# Patient Record
Sex: Male | Born: 1975 | Race: Asian | Hispanic: No | Marital: Married | Smoking: Former smoker
Health system: Southern US, Community
[De-identification: ages and names within clinical notes are randomized; demographics above are authoritative.]

## PROBLEM LIST (undated history)

## (undated) DIAGNOSIS — E785 Hyperlipidemia, unspecified: Secondary | ICD-10-CM

## (undated) HISTORY — PX: APPENDECTOMY: SHX54

## (undated) HISTORY — DX: Hyperlipidemia, unspecified: E78.5

---

## 2004-07-09 ENCOUNTER — Ambulatory Visit: Payer: Self-pay | Admitting: Internal Medicine

## 2008-05-13 ENCOUNTER — Ambulatory Visit: Payer: Self-pay | Admitting: Family Medicine

## 2008-05-13 DIAGNOSIS — F17200 Nicotine dependence, unspecified, uncomplicated: Secondary | ICD-10-CM | POA: Insufficient documentation

## 2008-05-13 HISTORY — DX: Nicotine dependence, unspecified, uncomplicated: F17.200

## 2008-05-15 ENCOUNTER — Telehealth (INDEPENDENT_AMBULATORY_CARE_PROVIDER_SITE_OTHER): Payer: Self-pay | Admitting: *Deleted

## 2008-05-15 LAB — CONVERTED CEMR LAB
ALT: 30 units/L (ref 0–53)
Albumin: 4.2 g/dL (ref 3.5–5.2)
Alkaline Phosphatase: 58 units/L (ref 39–117)
BUN: 13 mg/dL (ref 6–23)
Calcium: 9.5 mg/dL (ref 8.4–10.5)
Eosinophils Relative: 1.4 % (ref 0.0–5.0)
GFR calc Af Amer: 111 mL/min
Glucose, Bld: 112 mg/dL — ABNORMAL HIGH (ref 70–99)
HCT: 43.2 % (ref 39.0–52.0)
Hemoglobin: 15 g/dL (ref 13.0–17.0)
Monocytes Absolute: 0.4 10*3/uL (ref 0.1–1.0)
Monocytes Relative: 5.4 % (ref 3.0–12.0)
Neutro Abs: 4.1 10*3/uL (ref 1.4–7.7)
Potassium: 4.3 meq/L (ref 3.5–5.1)
Total CHOL/HDL Ratio: 6.6
Total Protein: 7.4 g/dL (ref 6.0–8.3)
WBC: 6.8 10*3/uL (ref 4.5–10.5)

## 2010-04-24 ENCOUNTER — Encounter: Payer: Self-pay | Admitting: Family Medicine

## 2010-04-24 ENCOUNTER — Ambulatory Visit
Admission: RE | Admit: 2010-04-24 | Discharge: 2010-04-24 | Payer: Self-pay | Source: Home / Self Care | Admitting: Family Medicine

## 2010-04-26 ENCOUNTER — Telehealth (INDEPENDENT_AMBULATORY_CARE_PROVIDER_SITE_OTHER): Payer: Self-pay | Admitting: *Deleted

## 2010-05-07 NOTE — Assessment & Plan Note (Signed)
Summary: POSSIBLE STREP THROAT/ NH (rm 2)   Vital Signs:  Patient Profile:   35 Years Old Male CC:      sore throat & ?fever  x 3 days,  Height:     73 inches Weight:      229 pounds O2 Sat:      98 % O2 treatment:    Room Air Temp:     98.8 degrees F oral Pulse rate:   79 / minute Resp:     14 per minute BP sitting:   123 / 77  (left arm) Cuff size:   large  Vitals Entered By: Lajean Saver RN (April 24, 2010 4:08 PM)                  Updated Prior Medication List: No Medications Current Allergies: No known allergies History of Present Illness Chief Complaint: sore throat & ?fever  x 3 days,  History of Present Illness:  Subjective: Patient complains of sore throat for 3 to 4 days. No cough No pleuritic pain No wheezing No nasal congestion No post-nasal drainage No sinus pain/pressure No itchy/red eyes No earache No hemoptysis No SOB No fever, ? chills No nausea No vomiting No abdominal pain No diarrhea No skin rashes + fatigue No myalgias No headache    REVIEW OF SYSTEMS Constitutional Symptoms       Complains of chills.     Denies fever, night sweats, weight loss, weight gain, and fatigue.  Eyes       Denies change in vision, eye pain, eye discharge, glasses, contact lenses, and eye surgery. Ear/Nose/Throat/Mouth       Complains of sore throat.      Denies hearing loss/aids, change in hearing, ear pain, ear discharge, dizziness, frequent runny nose, frequent nose bleeds, sinus problems, hoarseness, and tooth pain or bleeding.  Respiratory       Denies dry cough, productive cough, wheezing, shortness of breath, asthma, bronchitis, and emphysema/COPD.  Cardiovascular       Denies murmurs, chest pain, and tires easily with exhertion.    Gastrointestinal       Denies stomach pain, nausea/vomiting, diarrhea, constipation, blood in bowel movements, and indigestion. Genitourniary       Denies painful urination, kidney stones, and loss of urinary  control. Neurological       Denies paralysis, seizures, and fainting/blackouts. Musculoskeletal       Denies muscle pain, joint pain, joint stiffness, decreased range of motion, redness, swelling, muscle weakness, and gout.  Skin       Denies bruising, unusual mles/lumps or sores, and hair/skin or nail changes.  Psych       Denies mood changes, temper/anger issues, anxiety/stress, speech problems, depression, and sleep problems.  Past History:  Past Medical History: Unremarkable  Past Surgical History: Appendectomy  Family History: none  Social History: Occupation: Occupational hygienist Married Current Smoker 1 PPD Alcohol use-no Drug use-no 2 childrenSmoking Status:  current Drug Use:  no   Objective:  Appearance:  Patient appears healthy, stated age, and in no acute distress j Eyes:  Pupils are equal, round, and reactive to light and accomdation.  Extraocular movement is intact.  Conjunctivae are not inflamed.  Ears:  Canals normal.  Tympanic membranes normal.   Nose:  Normal septum.  Normal turbinates, mildly congested.  No sinus tenderness present.  Pharynx:  Erythematous and slightly swollen without obstruction.  Minimal exudate.  Neck:  Supple.   Tender enlarged anterior nodes are palpated bilaterally.  Lungs:  Clear to auscultation.  Breath sounds are equal.  Heart:  Regular rate and rhythm without murmurs, rubs, or gallops.  Assessment New Problems: PHARYNGITIS, STREPTOCOCCAL (ICD-034.0)   Plan New Medications/Changes: PENICILLIN V POTASSIUM 500 MG TABS (PENICILLIN V POTASSIUM) 1 by mouth Q8hr for 10 days.  #30 x 0, 04/24/2010, Donna Christen MD  New Orders: Rapid Strep [id number] New Patient Level III [99203] Planning Comments:   Begin penicillin for 10 days.  Ibuprofen 200mg , 4 tabs every 8 hours with food  Follow-up with PCP if not improving.   The patient and/or caregiver has been counseled thoroughly with regard to medications prescribed including dosage,  schedule, interactions, rationale for use, and possible side effects and they verbalize understanding.  Diagnoses and expected course of recovery discussed and will return if not improved as expected or if the condition worsens. Patient and/or caregiver verbalized understanding.  Prescriptions: PENICILLIN V POTASSIUM 500 MG TABS (PENICILLIN V POTASSIUM) 1 by mouth Q8hr for 10 days.  #30 x 0   Entered and Authorized by:   Donna Christen MD   Signed by:   Donna Christen MD on 04/24/2010   Method used:   Print then Give to Patient   RxID:   [id number]   Patient Instructions: 1)  May take Ibuprofen 200mg , 4 tabs every 8 hours with food for sore throat  Orders Added: 1)  Rapid Strep [[id number]] 2)  New Patient Level III [99203]    Laboratory Results  Date/Time Received: April 24, 2010 4:09 PM  Date/Time Reported: April 24, 2010 4:10 PM   Other Tests  Rapid Strep: positive  Kit Test Internal QC: Negative   (Normal Range: Negative)

## 2010-05-07 NOTE — Progress Notes (Signed)
  Phone Note Outgoing Call   Call placed by: Clemens Catholic LPN,  April 26, 2010 11:53 AM Call placed to: Patient Summary of Call: call back: left message to call back if any questions or concerns. Initial call taken by: Clemens Catholic LPN,  April 26, 2010 11:54 AM

## 2010-05-07 NOTE — Letter (Signed)
Summary: Out of Work  [facility]  [address]   [place], [place] [address]   Phone: [phone]  Fax: [phone]    April 24, 2010   Employee:  Moss Mc    To Whom It May Concern:   For Medical reasons, please excuse the above named employee from work today.      If you need additional information, please feel free to contact our office.         Sincerely,    Donna Christen MD

## 2010-05-07 NOTE — Letter (Signed)
Summary: Handout Printed  Printed Handout:  - Rheumatic Fever 

## 2010-09-03 ENCOUNTER — Encounter: Payer: Self-pay | Admitting: Family Medicine

## 2010-09-03 ENCOUNTER — Ambulatory Visit (INDEPENDENT_AMBULATORY_CARE_PROVIDER_SITE_OTHER): Payer: BC Managed Care – PPO | Admitting: Family Medicine

## 2010-09-03 DIAGNOSIS — R042 Hemoptysis: Secondary | ICD-10-CM | POA: Insufficient documentation

## 2010-09-03 NOTE — Patient Instructions (Signed)
Please go to the [facility] on [address] in [place] to get your chest xray We'll call you with your results Depending on the results we will most likely need to refer you to the lung specialist Call with any questions or concerns Hang in there!!!

## 2010-09-03 NOTE — Progress Notes (Signed)
  Subjective:    Patient ID: Bobby Ortega, male    DOB: 08/23/1975, 35 y.o.   MRN: [id number]  HPI  Coughing up blood- had sxs for 2 days last week.  Had considerable amount of blood w/ cough- coughed all day for 2 days.  No fevers, no chills, no nasal congestion, ear pain, sore throat.  No cough currently.  No hx of similar.  Pt is a smoker- 1 ppd.  Review of Systems For ROS see HPI     Objective:   Physical Exam  Constitutional: He appears well-developed and well-nourished. No distress.  HENT:  Head: Normocephalic and atraumatic.  Right Ear: Tympanic membrane normal.  Left Ear: Tympanic membrane normal.  Nose: No mucosal edema or rhinorrhea. Right sinus exhibits no maxillary sinus tenderness and no frontal sinus tenderness. Left sinus exhibits no maxillary sinus tenderness and no frontal sinus tenderness.  Mouth/Throat: Mucous membranes are normal. No oropharyngeal exudate, posterior oropharyngeal edema or posterior oropharyngeal erythema.  Eyes: Conjunctivae and EOM are normal. Pupils are equal, round, and reactive to light.  Neck: Normal range of motion. Neck supple.  Cardiovascular: Normal rate, regular rhythm and normal heart sounds.   Pulmonary/Chest: Effort normal and breath sounds normal. No respiratory distress. He has no wheezes.       No cough heard  Lymphadenopathy:    He has no cervical adenopathy.  Skin: Skin is warm and dry.          Assessment & Plan:

## 2010-09-06 ENCOUNTER — Encounter: Payer: Self-pay | Admitting: Family Medicine

## 2010-09-06 NOTE — Assessment & Plan Note (Signed)
Hemoptysis in a current smoker is concerning.  Get CXR- possibly CT depending on the results.  Will likely need to refer to pulm but will hold pending the results of the CXR.  Reviewed supportive care and red flags that should prompt return.  Pt expressed understanding and is in agreement w/ plan.

## 2010-09-07 ENCOUNTER — Ambulatory Visit (HOSPITAL_BASED_OUTPATIENT_CLINIC_OR_DEPARTMENT_OTHER)
Admission: RE | Admit: 2010-09-07 | Discharge: 2010-09-07 | Disposition: A | Discharge status: Home / Self Care | Payer: BC Managed Care – PPO | Source: Ambulatory Visit | Attending: Family Medicine | Admitting: Family Medicine

## 2010-09-07 DIAGNOSIS — R042 Hemoptysis: Secondary | ICD-10-CM | POA: Insufficient documentation

## 2010-09-07 DIAGNOSIS — R05 Cough: Secondary | ICD-10-CM

## 2010-09-07 DIAGNOSIS — F17200 Nicotine dependence, unspecified, uncomplicated: Secondary | ICD-10-CM | POA: Insufficient documentation

## 2010-09-07 NOTE — Progress Notes (Signed)
Addended by: Lucious Groves I on: 09/07/2010 04:51 PM   Modules accepted: Orders

## 2010-09-09 ENCOUNTER — Institutional Professional Consult (permissible substitution): Payer: BC Managed Care – PPO | Admitting: Internal Medicine

## 2010-09-18 ENCOUNTER — Encounter: Payer: Self-pay | Admitting: Family Medicine

## 2010-09-21 ENCOUNTER — Ambulatory Visit (INDEPENDENT_AMBULATORY_CARE_PROVIDER_SITE_OTHER): Payer: BC Managed Care – PPO | Admitting: Internal Medicine

## 2010-09-21 ENCOUNTER — Encounter: Payer: Self-pay | Admitting: Internal Medicine

## 2010-09-21 DIAGNOSIS — F17200 Nicotine dependence, unspecified, uncomplicated: Secondary | ICD-10-CM

## 2010-09-21 DIAGNOSIS — R042 Hemoptysis: Secondary | ICD-10-CM

## 2010-09-21 DIAGNOSIS — K920 Hematemesis: Secondary | ICD-10-CM | POA: Insufficient documentation

## 2010-09-21 NOTE — Assessment & Plan Note (Deleted)
This is not c/w hemoptysis. I think this might be mallory weiss related to very spicy food. Or some form of gasritis. cXR is clear. Clinically this is not pneumonia, TB, PE or even bronchitis. Advised expectant followup. If recurs, then can workup. He is agreeable to this plan

## 2010-09-21 NOTE — Assessment & Plan Note (Signed)
This is not c/w hemoptysis. I think this might be mallory weiss related to very spicy food. Or some form of gasritis. cXR is clear. Clinically this is not pneumonia, TB, PE or even bronchitis. Advised expectant followup. If recurs, then can workup. He is agreeable to this plan 

## 2010-09-21 NOTE — Progress Notes (Signed)
Subjective:    Patient ID: Bobby Ortega, male    DOB: [birth year], 35 y.o.   MRN: [id number]  HPI 40 year [place] immigrant. Smoker x 18 years x 0.75 ppd.  Referred for "hemoptysis". 2-3 weeks ago was eating something highly spicy ("numbing spicy") bean at [employer] [place] restaurant at [employer]  After he ate, felt throat dry. Few hours later cleared throat and spat out 'spit' and noticed it had blood in it. This lasted 2 days on and off but had constant throat discomfort (mild) for the entire 2 days. Water helped this. Subsequently it resolved. Subsequently saw Dr Beverely Low  one week later on 09/07/2010 and cxr clear (I personally reviewed). Has not had any recurrence since then. He insists that before eating spicy food he states he was completely well without cough, fever, sputum, sputum, chest pain, leg swelling, edema, salivation. . Even after the 2 days of "spitting" blood he is back to normal without gerd, increased salivation, edema, chest pain or any other symptoms. Note: he had told Dr. Beverely Low he was "coughing" blood but today he tells me he actually was 'spitting' bloood and to him 'spitting' blood is what he calls coughing. When I imitated a cough manever he denied that was what made him have hemoptysis.  Also, recollects PPD bein negative by history.   He is an active male. Works out regularly. Has furniture import business.   Review of Systems  Constitutional: Negative for fever and unexpected weight change.  HENT: Negative for ear pain, nosebleeds, congestion, sore throat, rhinorrhea, sneezing, trouble swallowing, dental problem, postnasal drip and sinus pressure.   Eyes: Negative for redness and itching.  Respiratory: Positive for cough. Negative for chest tightness, shortness of breath and wheezing.   Cardiovascular: Negative for palpitations and leg swelling.  Gastrointestinal: Negative for nausea and vomiting.  Genitourinary: Negative for dysuria.  Musculoskeletal: Negative for  joint swelling.  Skin: Negative for rash.  Neurological: Negative for headaches.  Hematological: Does not bruise/bleed easily.  Psychiatric/Behavioral: Negative for dysphoric mood. The patient is not nervous/anxious.        Objective:   Physical Exam  Nursing note and vitals reviewed. Constitutional: He is oriented to person, place, and time. He appears well-developed and well-nourished. No distress.  HENT:  Head: Normocephalic and atraumatic.  Right Ear: External ear normal.  Left Ear: External ear normal.  Mouth/Throat: Oropharynx is clear and moist. No oropharyngeal exudate.  Eyes: Conjunctivae and EOM are normal. Pupils are equal, round, and reactive to light. Right eye exhibits no discharge. Left eye exhibits no discharge. No scleral icterus.  Neck: Normal range of motion. Neck supple. No JVD present. No tracheal deviation present. No thyromegaly present.  Cardiovascular: Normal rate, regular rhythm and intact distal pulses.  Exam reveals no gallop and no friction rub.   No murmur heard. Pulmonary/Chest: Effort normal and breath sounds normal. No respiratory distress. He has no wheezes. He has no rales. He exhibits no tenderness.  Abdominal: Soft. Bowel sounds are normal. He exhibits no distension and no mass. There is no tenderness. There is no rebound and no guarding.  Musculoskeletal: Normal range of motion. He exhibits no edema and no tenderness.  Lymphadenopathy:    He has no cervical adenopathy.  Neurological: He is alert and oriented to person, place, and time. He has normal reflexes. No cranial nerve deficit. Coordination normal.  Skin: Skin is warm and dry. No rash noted. He is not diaphoretic. No erythema. No pallor.  Psychiatric: He has  a normal mood and affect. His behavior is normal. Judgment and thought content normal.          Assessment & Plan:

## 2010-09-21 NOTE — Assessment & Plan Note (Signed)
Advised to quit due to long term health conseuqnces

## 2010-09-21 NOTE — Patient Instructions (Signed)
Please quit smoking - this is very important Please follow with Dr. Beverely Low I think this blood came from your GI tract and not your respiratory tract We will keep an eye on this IF recurs, return or call us

## 2010-09-22 ENCOUNTER — Ambulatory Visit (INDEPENDENT_AMBULATORY_CARE_PROVIDER_SITE_OTHER): Payer: BC Managed Care – PPO | Admitting: Family Medicine

## 2010-09-22 ENCOUNTER — Encounter: Payer: Self-pay | Admitting: Family Medicine

## 2010-09-22 DIAGNOSIS — Z20828 Contact with and (suspected) exposure to other viral communicable diseases: Secondary | ICD-10-CM

## 2010-09-22 DIAGNOSIS — Z0000 Encounter for general adult medical examination without abnormal findings: Secondary | ICD-10-CM | POA: Insufficient documentation

## 2010-09-22 LAB — LIPID PANEL
Cholesterol: 213 mg/dL — ABNORMAL HIGH (ref 0–200)
HDL: 42.2 mg/dL (ref >39.00)
Triglycerides: 94 mg/dL (ref 0.0–149.0)
VLDL: 18.8 mg/dL (ref 0.0–40.0)

## 2010-09-22 LAB — LDL CHOLESTEROL, DIRECT: Direct LDL: 155 mg/dL

## 2010-09-22 LAB — CBC WITH DIFFERENTIAL/PLATELET
Basophils Relative: 0.6 % (ref 0.0–3.0)
Eosinophils Absolute: 0 10*3/uL (ref 0.0–0.7)
HCT: 43.5 % (ref 39.0–52.0)
Lymphs Abs: 1.9 10*3/uL (ref 0.7–4.0)
MCHC: 33.7 g/dL (ref 30.0–36.0)
MCV: 90.1 fl (ref 78.0–100.0)
Monocytes Absolute: 0.3 10*3/uL (ref 0.1–1.0)
Neutro Abs: 3.6 10*3/uL (ref 1.4–7.7)
Neutrophils Relative %: 61.1 % (ref 43.0–77.0)
RBC: 4.82 Mil/uL (ref 4.22–5.81)

## 2010-09-22 LAB — BASIC METABOLIC PANEL
BUN: 20 mg/dL (ref 6–23)
Calcium: 9.8 mg/dL (ref 8.4–10.5)
GFR: 109.86 mL/min (ref >60.00)
Glucose, Bld: 99 mg/dL (ref 70–99)
Potassium: 4.6 mEq/L (ref 3.5–5.1)

## 2010-09-22 LAB — RPR

## 2010-09-22 LAB — TSH: TSH: 0.55 u[IU]/mL (ref 0.35–5.50)

## 2010-09-22 LAB — HEPATIC FUNCTION PANEL
AST: 17 U/L (ref 0–37)
Albumin: 4.9 g/dL (ref 3.5–5.2)
Total Bilirubin: 0.8 mg/dL (ref 0.3–1.2)

## 2010-09-22 NOTE — Assessment & Plan Note (Signed)
Pt's PE WNL.  Check labs.  Anticipatory guidance provided.  Again strongly recommended smoking cessation.

## 2010-09-22 NOTE — Progress Notes (Signed)
Addended by: Sheliah Hatch on: 09/22/2010 11:11 AM   Modules accepted: Orders

## 2010-09-22 NOTE — Progress Notes (Signed)
  Subjective:    Patient ID: Bobby Ortega, male    DOB: January 22, 1976, 35 y.o.   MRN: [id number]  HPI CPE- no concerns today.   Review of Systems Patient reports no vision/hearing changes, anorexia, fever ,adenopathy, persistant/recurrent hoarseness, swallowing issues, chest pain, palpitations, edema, persistant/recurrent cough, hemoptysis, dyspnea (rest,exertional, paroxysmal nocturnal), gastrointestinal  bleeding (melena, rectal bleeding), abdominal pain, excessive heart burn, GU symptoms (dysuria, hematuria, voiding/incontinence issues) syncope, focal weakness, memory loss, numbness & tingling, skin/hair/nail changes, depression, anxiety, abnormal bruising/bleeding, musculoskeletal symptoms/signs.     Objective:   Physical Exam BP 114/78  Temp(Src) 99.2 F (37.3 C) (Oral)  Wt 217 lb 12.8 oz (98.793 kg)  General Appearance:    Alert, cooperative, no distress, appears stated age  Head:    Normocephalic, without obvious abnormality, atraumatic  Eyes:    PERRL, conjunctiva/corneas clear, EOM's intact, fundi    benign, both eyes       Ears:    Normal TM's and external ear canals, both ears  Nose:   Nares normal, septum midline, mucosa normal, no drainage   or sinus tenderness  Throat:   Lips, mucosa, and tongue normal; teeth and gums normal  Neck:   Supple, symmetrical, trachea midline, no adenopathy;       thyroid:  No enlargement/tenderness/nodules  Back:     Symmetric, no curvature, ROM normal, no CVA tenderness  Lungs:     Clear to auscultation bilaterally, respirations unlabored  Chest wall:    No tenderness or deformity  Heart:    Regular rate and rhythm, S1 and S2 normal, no murmur, rub   or gallop  Abdomen:     Soft, non-tender, bowel sounds active all four quadrants,    no masses, no organomegaly  Genitalia:    Normal male without lesion, masses, discharge or tenderness  Rectal:    Deferred due to age  Extremities:   Extremities normal, atraumatic, no cyanosis or edema  Pulses:    2+ and symmetric all extremities  Skin:   Skin color, texture, turgor normal, no rashes or lesions  Lymph nodes:   Cervical, supraclavicular, and axillary nodes normal  Neurologic:   CNII-XII intact. Normal strength, sensation and reflexes      throughout          Assessment & Plan:

## 2010-09-22 NOTE — Patient Instructions (Signed)
We'll notify you of your lab results Please call with any questions or concerns STOP SMOKING! If you again have bleeding, please call Have a great summer!

## 2010-09-23 ENCOUNTER — Encounter: Payer: Self-pay | Admitting: *Deleted

## 2010-09-23 ENCOUNTER — Telehealth: Payer: Self-pay | Admitting: *Deleted

## 2010-09-23 MED ORDER — SIMVASTATIN 20 MG PO TABS: 20.0000 mg | ORAL_TABLET | Freq: Every day | ORAL | Status: DC

## 2010-09-23 NOTE — Telephone Encounter (Signed)
Pt was notified of lab results and notes that he previous spoke with you about Hep B testing. Pt notes that his father died from complications of this and he will come to the office for labs. Please advise of what labs are needed and what dx. Thanks.

## 2010-09-23 NOTE — Telephone Encounter (Signed)
Needs hep B surface antibody, core antibody, core antigen

## 2010-09-23 NOTE — Telephone Encounter (Signed)
v01.79

## 2010-09-23 NOTE — Progress Notes (Signed)
Addended by: Lucious Groves I on: 09/23/2010 11:39 AM   Modules accepted: Orders

## 2010-09-23 NOTE — Telephone Encounter (Signed)
Noted, thanks!

## 2010-09-23 NOTE — Telephone Encounter (Signed)
What diagnosis?

## 2012-03-21 IMAGING — CR DG CHEST 2V
2 series · 2 of 2 positions shown · non-contrast
Comparison: None.

CLINICAL DATA: Hemoptysis.  Tobacco use.  Cough.

CHEST - 2 VIEW

[w chest pa]
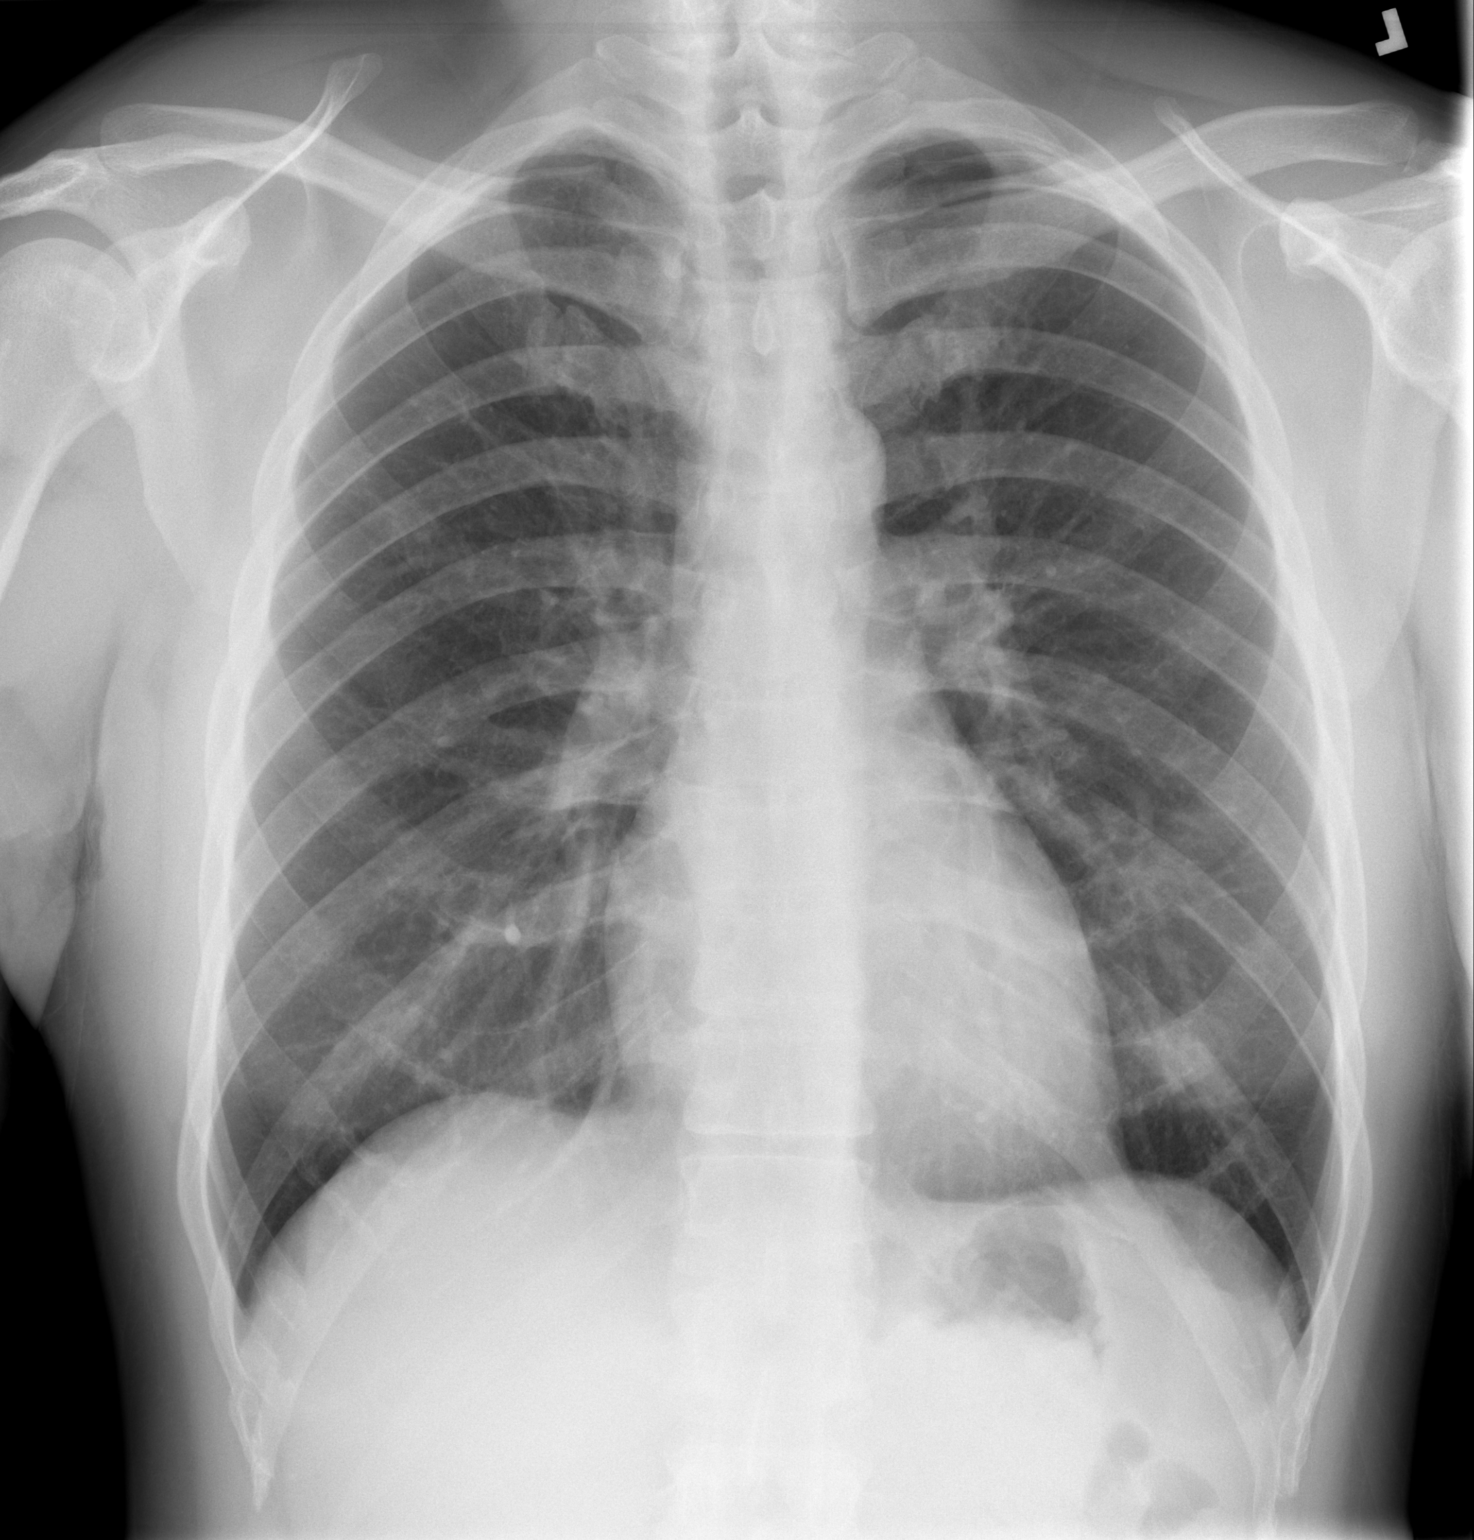

[w chest lat]
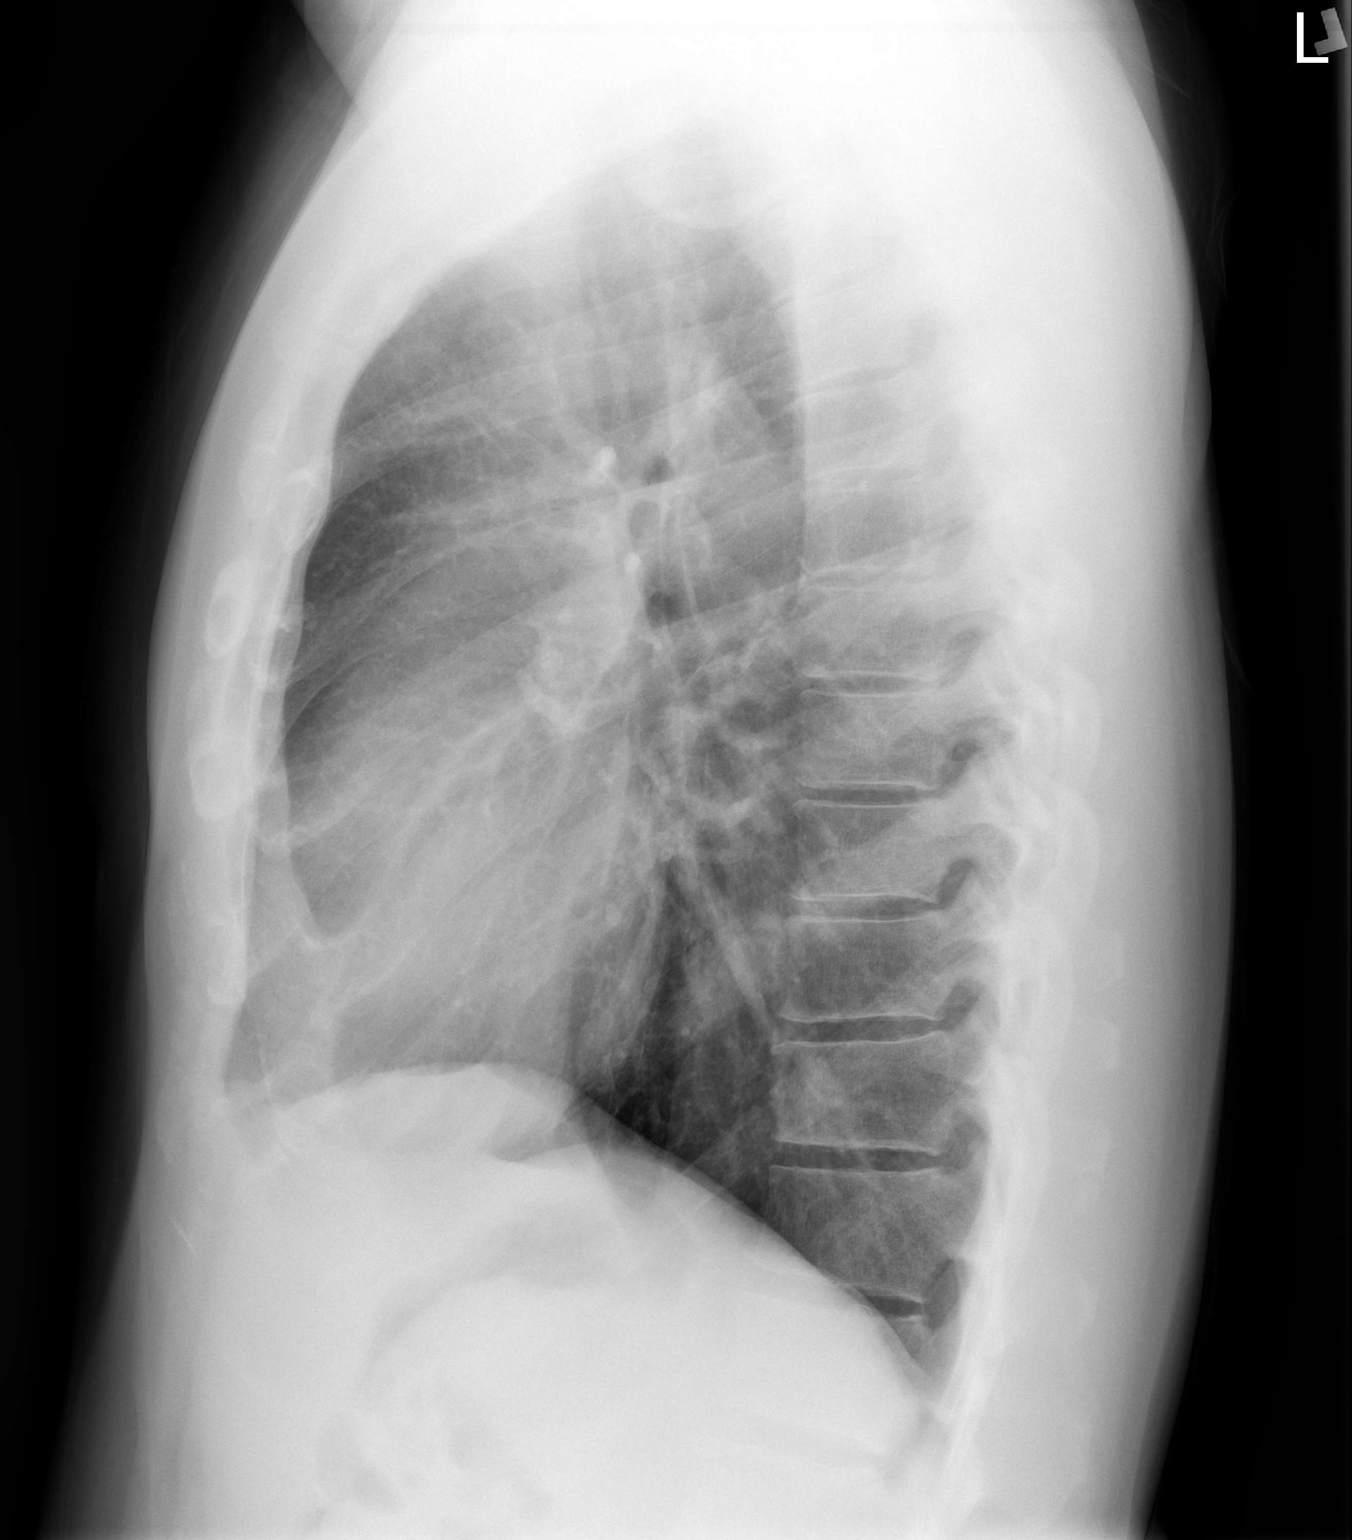

[2 of 2 positions shown; findings below may reference images not displayed]

FINDINGS: Cardiac and mediastinal contours appear normal.

The lungs appear clear.

No pleural effusion is identified.
IMPRESSION: No significant abnormality identified.  If the patient truly has
hemoptysis, chest CT may be warranted to exclude underlying occult
malignancy or embolus.

## 2013-06-11 ENCOUNTER — Emergency Department (INDEPENDENT_AMBULATORY_CARE_PROVIDER_SITE_OTHER)
Admission: EM | Admit: 2013-06-11 | Discharge: 2013-06-11 | Disposition: A | Discharge status: Home / Self Care | Payer: BC Managed Care – PPO | Source: Home / Self Care | Attending: Family Medicine | Admitting: Family Medicine

## 2013-06-11 ENCOUNTER — Encounter: Payer: Self-pay | Admitting: Emergency Medicine

## 2013-06-11 DIAGNOSIS — J029 Acute pharyngitis, unspecified: Secondary | ICD-10-CM

## 2013-06-11 DIAGNOSIS — J069 Acute upper respiratory infection, unspecified: Secondary | ICD-10-CM

## 2013-06-11 LAB — POCT RAPID STREP A (OFFICE): Rapid Strep A Screen: NEGATIVE

## 2013-06-11 MED ORDER — AZITHROMYCIN 250 MG PO TABS: ORAL_TABLET | ORAL | Status: DC

## 2013-06-11 MED ORDER — BENZONATATE 200 MG PO CAPS: 200.0000 mg | ORAL_CAPSULE | Freq: Every day | ORAL | Status: DC

## 2013-06-11 NOTE — Discharge Instructions (Signed)
Take plain Mucinex (1200 mg guaifenesin) twice daily for cough and congestion.  May add Sudafed for sinus congestion.   Increase fluid intake, rest. May use Afrin nasal spray (or generic oxymetazoline) twice daily for about 5 days.  Also recommend using saline nasal spray several times daily and saline nasal irrigation (AYR is a common brand) Try warm salt water gargles for sore throat.  Stop all antihistamines for now, and other non-prescription cough/cold preparations. May take Ibuprofen 200mg , 4 tabs every 8 hours with food for chest pain, fever, headache, etc. Begin Azithromycin if not improving about 5 days or if persistent fever develops   Follow-up with family doctor if not improving 7 to 10 days.    Salt Water Gargle This solution will help make your mouth and throat feel better. HOME CARE INSTRUCTIONS   Mix 1 teaspoon of salt in 8 ounces of warm water.  Gargle with this solution as much or often as you need or as directed. Swish and gargle gently if you have any sores or wounds in your mouth.  Do not swallow this mixture. Document Released: 12/25/2003 Document Revised: 06/14/2011 Document Reviewed: 05/17/2008 [facility] Patient Information 2014 [place], [place].

## 2013-06-11 NOTE — ED Notes (Signed)
Bobby Ortega complains of sore throat, chills, body aches and productive cough with green sputum for 1 day. Denies fever and sweats.

## 2013-06-11 NOTE — ED Provider Notes (Signed)
CSN: [id number]     Arrival date & time 06/11/13  0908 History   First MD Initiated Contact with Patient 06/11/13 1034     Chief Complaint  Patient presents with  . Sore Throat    x 1 day  . Cough    x 1 day  . Generalized Body Aches    x 1 day      HPI Comments: Yesterday patient developed myalgias, fatigue, sore throat, cough, chills, and tightness in anterior chest.  The history is provided by the patient.    History reviewed. No pertinent past medical history. Past Surgical History  Procedure Laterality Date  . Appendectomy     Family History  Problem Relation Age of Onset  . Cancer Mother     breast  . Cancer Father     Liver   History  Substance Use Topics  . Smoking status: Current Every Day Smoker -- 1.00 packs/day for 18 years    Types: Cigarettes  . Smokeless tobacco: Not on file  . Alcohol Use: No    Review of Systems + sore throat + cough No pleuritic pain, but has tightness in anterior chest No wheezing + nasal congestion + post-nasal drainage No sinus pain/pressure No itchy/red eyes No earache No hemoptysis No SOB No fever, + chills No nausea No vomiting No abdominal pain No diarrhea No urinary symptoms No skin rash + fatigue + myalgias No headache Used OTC meds without relief  Allergies  Review of patient's allergies indicates no known allergies.  Home Medications   Current Outpatient Rx  Name  Route  Sig  Dispense  Refill  . aspirin 325 MG EC tablet   Oral   Take 325 mg by mouth as needed.           Marland Kitchen azithromycin (ZITHROMAX Z-PAK) 250 MG tablet      Take 2 tabs today; then begin one tab once daily for 4 more days. (Rx void after 06/19/13)   6 each   0   . benzonatate (TESSALON) 200 MG capsule   Oral   Take 1 capsule (200 mg total) by mouth at bedtime. Take as needed for cough   12 capsule   0   . Naproxen Sodium (ALEVE) 220 MG CAPS   Oral   Take by mouth as needed.           Marland Kitchen EXPIRED: simvastatin (ZOCOR) 20 MG  tablet   Oral   Take 1 tablet (20 mg total) by mouth at bedtime.   30 tablet   3    BP 131/79  Pulse 84  Temp(Src) 98.4 F (36.9 C) (Oral)  Ht 6\' 1"  (1.854 m)  Wt 242 lb (109.77 kg)  BMI 31.93 kg/m2  SpO2 97% Physical Exam Nursing notes and Vital Signs reviewed. Appearance:  Patient appears healthy, stated age, and in no acute distress Eyes:  Pupils are equal, round, and reactive to light and accomodation.  Extraocular movement is intact.  Conjunctivae are not inflamed  Ears:  Canals normal.  Tympanic membranes normal.  Nose:  Mildly congested turbinates.  No sinus tenderness.   Pharynx:  Mildly erythematous Neck:  Supple.  Tender enlarged posterior nodes are palpated bilaterally  Lungs:  Clear to auscultation.  Breath sounds are equal.  Heart:  Regular rate and rhythm without murmurs, rubs, or gallops.  Abdomen:  Nontender without masses or hepatosplenomegaly.  Bowel sounds are present.  No CVA or flank tenderness.  Extremities:  No edema.  No calf tenderness Skin:  No rash present.   ED Course  Procedures  none    Labs Reviewed  POCT RAPID STREP A (OFFICE) - Negative        MDM   1. Sore throat   2. Acute upper respiratory infections of unspecified site; suspect early viral URI    There is no evidence of bacterial infection today.  Treat symptomatically for now  Prescription written for Benzonatate (Tessalon) to take at bedtime for night-time cough.  Take plain Mucinex (1200 mg guaifenesin) twice daily for cough and congestion.  May add Sudafed for sinus congestion.   Increase fluid intake, rest. May use Afrin nasal spray (or generic oxymetazoline) twice daily for about 5 days.  Also recommend using saline nasal spray several times daily and saline nasal irrigation (AYR is a common brand) Try warm salt water gargles for sore throat.  Stop all antihistamines for now, and other non-prescription cough/cold preparations. May take Ibuprofen 200mg , 4 tabs every 8 hours  with food for chest pain, fever, headache, etc. Begin Azithromycin if not improving about 5 days or if persistent fever develops (Given a prescription to hold, with an expiration date)  Follow-up with family doctor if not improving 7 to 10 days.     Lattie HawStephen A Sinai Illingworth, MD 06/12/13 [phone]

## 2013-06-14 ENCOUNTER — Telehealth: Payer: Self-pay | Admitting: Emergency Medicine

## 2015-06-24 ENCOUNTER — Ambulatory Visit (INDEPENDENT_AMBULATORY_CARE_PROVIDER_SITE_OTHER): Payer: BLUE CROSS/BLUE SHIELD | Admitting: Physician Assistant

## 2015-06-24 ENCOUNTER — Encounter: Payer: Self-pay | Admitting: Physician Assistant

## 2015-06-24 VITALS — BP 108/68 | HR 89 | Temp 98.6°F | Ht 73.0 in | Wt 245.8 lb

## 2015-06-24 DIAGNOSIS — R140 Abdominal distension (gaseous): Secondary | ICD-10-CM | POA: Diagnosis not present

## 2015-06-24 DIAGNOSIS — J019 Acute sinusitis, unspecified: Secondary | ICD-10-CM | POA: Insufficient documentation

## 2015-06-24 MED ORDER — AZITHROMYCIN 250 MG PO TABS: ORAL_TABLET | ORAL | Status: DC

## 2015-06-24 NOTE — Assessment & Plan Note (Signed)
Rx Doxycycline.  Increase fluids.  Rest.  Saline nasal spray.  Probiotic.  Mucinex as directed.  Humidifier in bedroom.  Call or return to clinic if symptoms are not improving.  

## 2015-06-24 NOTE — Patient Instructions (Signed)
Please take antibiotic as directed.  Increase fluid intake.  Use Saline nasal spray.  Take a daily multivitamin. Delsym for cough.  Place a humidifier in the bedroom.  Please call or return clinic if symptoms are not improving.   Don't forget to start a daily probiotic.  Sinusitis Sinusitis is redness, soreness, and swelling (inflammation) of the paranasal sinuses. Paranasal sinuses are air pockets within the bones of your face (beneath the eyes, the middle of the forehead, or above the eyes). In healthy paranasal sinuses, mucus is able to drain out, and air is able to circulate through them by way of your nose. However, when your paranasal sinuses are inflamed, mucus and air can become trapped. This can allow bacteria and other germs to grow and cause infection. Sinusitis can develop quickly and last only a short time (acute) or continue over a long period (chronic). Sinusitis that lasts for more than 12 weeks is considered chronic.  CAUSES  Causes of sinusitis include:  Allergies.  Structural abnormalities, such as displacement of the cartilage that separates your nostrils (deviated septum), which can decrease the air flow through your nose and sinuses and affect sinus drainage.  Functional abnormalities, such as when the small hairs (cilia) that line your sinuses and help remove mucus do not work properly or are not present. SYMPTOMS  Symptoms of acute and chronic sinusitis are the same. The primary symptoms are pain and pressure around the affected sinuses. Other symptoms include:  Upper toothache.  Earache.  Headache.  Bad breath.  Decreased sense of smell and taste.  A cough, which worsens when you are lying flat.  Fatigue.  Fever.  Thick drainage from your nose, which often is green and may contain pus (purulent).  Swelling and warmth over the affected sinuses. DIAGNOSIS  Your caregiver will perform a physical exam. During the exam, your caregiver may:  Look in your  nose for signs of abnormal growths in your nostrils (nasal polyps).  Tap over the affected sinus to check for signs of infection.  View the inside of your sinuses (endoscopy) with a special imaging device with a light attached (endoscope), which is inserted into your sinuses. If your caregiver suspects that you have chronic sinusitis, one or more of the following tests may be recommended:  Allergy tests.  Nasal culture A sample of mucus is taken from your nose and sent to a lab and screened for bacteria.  Nasal cytology A sample of mucus is taken from your nose and examined by your caregiver to determine if your sinusitis is related to an allergy. TREATMENT  Most cases of acute sinusitis are related to a viral infection and will resolve on their own within 10 days. Sometimes medicines are prescribed to help relieve symptoms (pain medicine, decongestants, nasal steroid sprays, or saline sprays).  However, for sinusitis related to a bacterial infection, your caregiver will prescribe antibiotic medicines. These are medicines that will help kill the bacteria causing the infection.  Rarely, sinusitis is caused by a fungal infection. In theses cases, your caregiver will prescribe antifungal medicine. For some cases of chronic sinusitis, surgery is needed. Generally, these are cases in which sinusitis recurs more than 3 times per year, despite other treatments. HOME CARE INSTRUCTIONS   Drink plenty of water. Water helps thin the mucus so your sinuses can drain more easily.  Use a humidifier.  Inhale steam 3 to 4 times a day (for example, sit in the bathroom with the shower running).  Apply a  warm, moist washcloth to your face 3 to 4 times a day, or as directed by your caregiver.  Use saline nasal sprays to help moisten and clean your sinuses.  Take over-the-counter or prescription medicines for pain, discomfort, or fever only as directed by your caregiver. SEEK IMMEDIATE MEDICAL CARE  IF:  You have increasing pain or severe headaches.  You have nausea, vomiting, or drowsiness.  You have swelling around your face.  You have vision problems.  You have a stiff neck.  You have difficulty breathing. MAKE SURE YOU:   Understand these instructions.  Will watch your condition.  Will get help right away if you are not doing well or get worse. Document Released: 03/22/2005 Document Revised: 06/14/2011 Document Reviewed: 04/06/2011 [facility] Patient Information 2014 [place], [place].

## 2015-06-24 NOTE — Progress Notes (Signed)
   Patient presents to clinic today c/o 1 week of chest congestion, cough (green-red) but has had a couple of nosebleeds with sinus pressure and sinus pain. Denies ear pain. Denies fever. Denies sick contact. Patient is a current smoker.   Endorses bloated feeling mainly with sitting down after a meal. Endorses symptoms present for 3-4 days. Endorses worse with tighter-fitting pants. Denies abdominal pain or reflux. Denies change to bowel movements.  Past Medical History  Diagnosis Date  . Hyperlipidemia     Current Outpatient Prescriptions on File Prior to Visit  Medication Sig Dispense Refill  . Naproxen Sodium (ALEVE) 220 MG CAPS Take by mouth as needed.       No current facility-administered medications on file prior to visit.    No Known Allergies  Family History  Problem Relation Age of Onset  . Cancer Mother     breast  . Cancer Father     Liver    Social History   Social History  . Marital Status: Married    Spouse Name: N/A  . Number of Children: N/A  . Years of Education: N/A   Social History Main Topics  . Smoking status: Current Every Day Smoker -- 1.00 packs/day for 18 years    Types: Cigarettes  . Smokeless tobacco: Never Used  . Alcohol Use: No  . Drug Use: No  . Sexual Activity: Not Asked   Other Topics Concern  . None   Social History Narrative   Review of Systems  Constitutional: Negative for fever.  HENT: Positive for congestion and sore throat.   Respiratory: Positive for cough and sputum production. Negative for hemoptysis, shortness of breath and wheezing.   Gastrointestinal: Negative for heartburn, nausea, vomiting, abdominal pain, diarrhea, constipation, blood in stool and melena.     BP 108/68 mmHg  Pulse 89  Temp(Src) 98.6 F (37 C) (Oral)  Ht 6\' 1"  (1.854 m)  Wt 245 lb 12.8 oz (111.494 kg)  BMI 32.44 kg/m2  SpO2 98%  Physical Exam  Constitutional: He is oriented to person, place, and time and well-developed, well-nourished,  and in no distress.  HENT:  Head: Normocephalic and atraumatic.  Right Ear: External ear normal.  Left Ear: External ear normal.  Nose: Nose normal.  Mouth/Throat: Oropharynx is clear and moist. No oropharyngeal exudate.  + clear PND noted. + TTP sinus noted  Eyes: Conjunctivae are normal.  Neck: Neck supple.  Cardiovascular: Normal rate, regular rhythm, normal heart sounds and intact distal pulses.   Pulmonary/Chest: Effort normal and breath sounds normal. No respiratory distress. He has no wheezes. He has no rales. He exhibits no tenderness.  Abdominal: Soft. Bowel sounds are normal. He exhibits no distension and no mass. There is no tenderness.  Neurological: He is alert and oriented to person, place, and time.  Skin: Skin is warm and dry. No rash noted.  Psychiatric: Affect normal.  Vitals reviewed.  Assessment/Plan: Bloating Exam unremarkable. Dietary measures reviewed. Will begin daily probiotic. Follow-up PRN.  Acute infection of nasal sinus Rx Doxycycline.  Increase fluids.  Rest.  Saline nasal spray.  Probiotic.  Mucinex as directed.  Humidifier in bedroom.  Call or return to clinic if symptoms are not improving.

## 2015-06-24 NOTE — Progress Notes (Signed)
Pre visit review using our clinic review tool, if applicable. No additional management support is needed unless otherwise documented below in the visit note. 

## 2015-06-24 NOTE — Assessment & Plan Note (Signed)
Exam unremarkable. Dietary measures reviewed. Will begin daily probiotic. Follow-up PRN.

## 2015-08-11 ENCOUNTER — Ambulatory Visit (HOSPITAL_BASED_OUTPATIENT_CLINIC_OR_DEPARTMENT_OTHER)
Admission: RE | Admit: 2015-08-11 | Discharge: 2015-08-11 | Disposition: A | Discharge status: Home / Self Care | Payer: BLUE CROSS/BLUE SHIELD | Source: Ambulatory Visit | Attending: Family Medicine | Admitting: Family Medicine

## 2015-08-11 ENCOUNTER — Encounter: Payer: Self-pay | Admitting: Family Medicine

## 2015-08-11 ENCOUNTER — Ambulatory Visit (INDEPENDENT_AMBULATORY_CARE_PROVIDER_SITE_OTHER): Payer: BLUE CROSS/BLUE SHIELD | Admitting: Family Medicine

## 2015-08-11 VITALS — BP 112/78 | HR 76 | Temp 98.5°F | Resp 16 | Wt 244.4 lb

## 2015-08-11 DIAGNOSIS — X58XXXA Exposure to other specified factors, initial encounter: Secondary | ICD-10-CM | POA: Insufficient documentation

## 2015-08-11 DIAGNOSIS — Z23 Encounter for immunization: Secondary | ICD-10-CM

## 2015-08-11 DIAGNOSIS — S91339A Puncture wound without foreign body, unspecified foot, initial encounter: Secondary | ICD-10-CM | POA: Diagnosis not present

## 2015-08-11 DIAGNOSIS — L089 Local infection of the skin and subcutaneous tissue, unspecified: Secondary | ICD-10-CM | POA: Insufficient documentation

## 2015-08-11 MED ORDER — AMOXICILLIN-POT CLAVULANATE 875-125 MG PO TABS: 1.0000 | ORAL_TABLET | Freq: Two times a day (BID) | ORAL | Status: DC

## 2015-08-11 NOTE — Progress Notes (Signed)
   Subjective:    Patient ID: Bobby Ortega, male    DOB: 05-07-1975, 40 y.o.   MRN: [id number]  HPI L foot pain- pt stepped on something while barefoot at home a few days ago and now has pain, redness.  Pt is not sure if there is a retained foreign body or not.  The 1st 2 days, no pain w/ weight bearing but this is worsening w/ time.  Using Neosporin.  No hx of Tdap.   Review of Systems For ROS see HPI     Objective:   Physical Exam  Constitutional: He appears well-developed and well-nourished. No distress.  HENT:  Head: Normocephalic and atraumatic.  Cardiovascular: Intact distal pulses.   Skin: Skin is warm and dry. There is erythema (redness surrounding wound on plantar surface of L foot.  Wound is not consistent w/ puncture- pt reports he used scissors and nail clippers to try and remove the skin and get a better look.  mild induration, some fluctuance but no pocket for I&D.).  Vitals reviewed.         Assessment & Plan:

## 2015-08-11 NOTE — Patient Instructions (Signed)
Go [facility] at [facility] today or tomorrow Start the Augmentin twice daily- take w/ food- for infection We'll call you with your podiatry appt for them to evaluate and treat your foot Try and keep foot clean and dry Call with any questions or concerns Hang in there!!!

## 2015-08-11 NOTE — Progress Notes (Signed)
Medication filled to pharmacy as requested.   

## 2015-08-12 ENCOUNTER — Ambulatory Visit: Payer: BLUE CROSS/BLUE SHIELD | Admitting: Physician Assistant

## 2015-08-14 ENCOUNTER — Ambulatory Visit: Payer: Self-pay

## 2015-08-14 ENCOUNTER — Ambulatory Visit (INDEPENDENT_AMBULATORY_CARE_PROVIDER_SITE_OTHER): Payer: BLUE CROSS/BLUE SHIELD | Admitting: Podiatry

## 2015-08-14 ENCOUNTER — Encounter: Payer: Self-pay | Admitting: Podiatry

## 2015-08-14 VITALS — BP 103/72 | HR 85 | Resp 16 | Ht 73.0 in | Wt 244.0 lb

## 2015-08-14 DIAGNOSIS — M79672 Pain in left foot: Secondary | ICD-10-CM

## 2015-08-14 DIAGNOSIS — L02619 Cutaneous abscess of unspecified foot: Secondary | ICD-10-CM

## 2015-08-14 DIAGNOSIS — L03119 Cellulitis of unspecified part of limb: Secondary | ICD-10-CM

## 2015-08-14 NOTE — Progress Notes (Signed)
   Subjective:    Patient ID: Bobby Ortega, male    DOB: 03/19/1976, 40 y.o.   MRN: [id number]  HPI Chief Complaint  Patient presents with  . Foot Pain    Left foot; midfoot (near arch); pt stated, "Stepped on chicken or dog bone last week; on antibiotics"   Pt got x-rays done at [facility] on 08/11/15   Review of Systems  All other systems reviewed and are negative.      Objective:   Physical Exam        Assessment & Plan:

## 2015-08-14 NOTE — Progress Notes (Signed)
Subjective:     Patient ID: Bobby Ortega, male   DOB: 12/07/1975, 40 y.o.   MRN: [id number]  HPI patient states that he stepped on some type of a bone last week and traumatized the bottom of his left foot and then try to get it out himself and developed redness swelling and is just started and antibiotic with mild improvement but wanted to be checked   Review of Systems  All other systems reviewed and are negative.      Objective:   Physical Exam  Constitutional: He is oriented to person, place, and time.  Cardiovascular: Intact distal pulses.   Musculoskeletal: Normal range of motion.  Neurological: He is oriented to person, place, and time.  Skin: Skin is warm.  Nursing note and vitals reviewed.  neurovascular status found to be intact muscle strength adequate range of motion within normal limits with patient found to have irritation of the plantar aspect of the left lateral arch with an area where there is been obvious trauma. I did not note any distention and there is no current drainage odor or proximal erythema edema associated with this     Assessment:     Probability for trauma to the left foot creating infective process which currently is under control with antibiotics    Plan:     H&P and I reviewed his x-rays with him. Today I have recommended utilization of continued Augmentin 875 and gave him strict instructions of any redness were to occur drainage or any symptoms he is to reappoint immediately or if any systemic signs of infection were to  occur he is to go straight to the emergency room

## 2015-08-20 NOTE — Assessment & Plan Note (Signed)
New.  Will get xray to assess for retained foreign body but none seen today.  Wound seems to be infected more from him digging and cutting away healthy skin than from the puncture.  Start abx.  Refer to podiatry for complete evaluation.  No obvious pus pocket to I&D today.  Reviewed supportive care and red flags that should prompt return.  Pt expressed understanding and is in agreement w/ plan.

## 2015-09-17 ENCOUNTER — Encounter: Payer: Self-pay | Admitting: Family Medicine

## 2015-09-17 ENCOUNTER — Ambulatory Visit (INDEPENDENT_AMBULATORY_CARE_PROVIDER_SITE_OTHER): Payer: BLUE CROSS/BLUE SHIELD | Admitting: Family Medicine

## 2015-09-17 VITALS — BP 122/68 | HR 64 | Temp 98.5°F | Resp 17 | Ht 73.0 in | Wt 243.2 lb

## 2015-09-17 DIAGNOSIS — R21 Rash and other nonspecific skin eruption: Secondary | ICD-10-CM

## 2015-09-17 MED ORDER — METHYLPREDNISOLONE ACETATE 80 MG/ML IJ SUSP
80.0000 mg | Freq: Once | INTRAMUSCULAR | Status: AC
  Administered 2015-09-17: 80 mg via INTRAMUSCULAR

## 2015-09-17 MED ORDER — PREDNISONE 10 MG PO TABS: ORAL_TABLET | ORAL | Status: DC

## 2015-09-17 NOTE — Patient Instructions (Signed)
Follow up as needed Start the Prednisone tomorrow (all 3 pills at the same time for 3 days, then 2 pills at the same time for 3 days, and then 1 pill daily) Benadryl as needed for itching Drink plenty of fluids Call with any questions or concerns Hang in there!

## 2015-09-17 NOTE — Progress Notes (Signed)
Pre visit review using our clinic review tool, if applicable. No additional management support is needed unless otherwise documented below in the visit note. 

## 2015-09-17 NOTE — Addendum Note (Signed)
Addended by: Yvone NeuBRODMERKEL, JESSICA L on: 09/17/2015 11:16 AM   Modules accepted: Orders

## 2015-09-17 NOTE — Progress Notes (Signed)
   Subjective:    Patient ID: Bobby Ortega, male    DOB: 08/28/1975, 40 y.o.   MRN: [id number]  HPI Rash- sxs started 2 days ago.  [facility] [place]  sxs started in armpits bilaterally w/ itching.  sxs erupted last night after taking hot shower- covering his back, parts of abd, buttock, some on arms and legs but less so.  sxs are worse w/ heat.  No change in detergent, no sunscreen, change in body wash.  No one else at home has rash.  [place]   Review of Systems For ROS see HPI     Objective:   Physical Exam  Constitutional: He is oriented to person, place, and time. He appears well-developed and well-nourished. No distress.  Neurological: He is alert and oriented to person, place, and time.  Skin: Skin is warm and dry. Rash (diffuse maculopapular rash on back, abd, buttocks, w/ less obvious rash on arms and legs bilaterally) noted. There is erythema.  Psychiatric: He has a normal mood and affect. His behavior is normal. Thought content normal.  Vitals reviewed.         Assessment & Plan:  Rash- no known cause but pt's rash is consistent w/ either confluent urticaria or contact dermatitis (but not poison ivy).  Pt denies changes to his detergents or soaps and no change in routine w/ [place]- but he did not swim.  Due to diffuse distribution of rash, Depo Medrol given in office and pt to start Prednisone taper tomorrow.  Reviewed supportive care and red flags that should prompt return.  Pt expressed understanding and is in agreement w/ plan.

## 2015-09-26 ENCOUNTER — Other Ambulatory Visit (INDEPENDENT_AMBULATORY_CARE_PROVIDER_SITE_OTHER): Payer: BLUE CROSS/BLUE SHIELD

## 2015-09-26 ENCOUNTER — Encounter: Payer: Self-pay | Admitting: Family Medicine

## 2015-09-26 ENCOUNTER — Other Ambulatory Visit: Payer: Self-pay | Admitting: Family Medicine

## 2015-09-26 ENCOUNTER — Ambulatory Visit (INDEPENDENT_AMBULATORY_CARE_PROVIDER_SITE_OTHER): Payer: BLUE CROSS/BLUE SHIELD | Admitting: Family Medicine

## 2015-09-26 VITALS — BP 120/76 | HR 84 | Temp 98.1°F | Resp 16 | Ht 73.0 in | Wt 239.1 lb

## 2015-09-26 DIAGNOSIS — Z1159 Encounter for screening for other viral diseases: Secondary | ICD-10-CM | POA: Diagnosis not present

## 2015-09-26 DIAGNOSIS — Z0000 Encounter for general adult medical examination without abnormal findings: Secondary | ICD-10-CM

## 2015-09-26 LAB — HEPATIC FUNCTION PANEL
ALT: 82 U/L — ABNORMAL HIGH (ref 9–46)
AST: 41 U/L — ABNORMAL HIGH (ref 10–40)
Albumin: 4.2 g/dL (ref 3.6–5.1)
Alkaline Phosphatase: 51 U/L (ref 40–115)
BILIRUBIN DIRECT: 0.1 mg/dL (ref <=0.2)
BILIRUBIN INDIRECT: 0.5 mg/dL (ref 0.2–1.2)
BILIRUBIN TOTAL: 0.6 mg/dL (ref 0.2–1.2)
Total Protein: 6.6 g/dL (ref 6.1–8.1)

## 2015-09-26 LAB — CBC WITH DIFFERENTIAL/PLATELET
BASOS PCT: 0 %
Basophils Absolute: 0 cells/uL (ref 0–200)
EOS PCT: 1 %
Eosinophils Absolute: 65 cells/uL (ref 15–500)
HCT: 42.3 % (ref 38.5–50.0)
HEMOGLOBIN: 14 g/dL (ref 13.2–17.1)
Lymphocytes Relative: 34 %
Lymphs Abs: 2210 cells/uL (ref 850–3900)
MCH: 29.2 pg (ref 27.0–33.0)
MCHC: 33.1 g/dL (ref 32.0–36.0)
MCV: 88.3 fL (ref 80.0–100.0)
MPV: 10.2 fL (ref 7.5–12.5)
Monocytes Absolute: 520 cells/uL (ref 200–950)
Monocytes Relative: 8 %
NEUTROS PCT: 57 %
Neutro Abs: 3705 cells/uL (ref 1500–7800)
Platelets: 267 10*3/uL (ref 140–400)
RBC: 4.79 MIL/uL (ref 4.20–5.80)
RDW: 14 % (ref 11.0–15.0)
WBC: 6.5 10*3/uL (ref 3.8–10.8)

## 2015-09-26 LAB — BASIC METABOLIC PANEL
BUN: 12 mg/dL (ref 7–25)
CALCIUM: 8.6 mg/dL (ref 8.6–10.3)
CO2: 26 mmol/L (ref 20–31)
Chloride: 101 mmol/L (ref 98–110)
Creat: 0.88 mg/dL (ref 0.60–1.35)
Glucose, Bld: 104 mg/dL — ABNORMAL HIGH (ref 65–99)
POTASSIUM: 3.8 mmol/L (ref 3.5–5.3)
Sodium: 139 mmol/L (ref 135–146)

## 2015-09-26 LAB — LIPID PANEL
CHOL/HDL RATIO: 5.4 ratio — AB (ref <=5.0)
CHOLESTEROL: 174 mg/dL (ref 125–200)
HDL: 32 mg/dL — ABNORMAL LOW (ref >=40)
LDL Cholesterol: 106 mg/dL (ref <130)
TRIGLYCERIDES: 182 mg/dL — AB (ref <150)
VLDL: 36 mg/dL — AB (ref <30)

## 2015-09-26 LAB — TSH: TSH: 0.73 mIU/L (ref 0.40–4.50)

## 2015-09-26 NOTE — Progress Notes (Signed)
   Subjective:    Patient ID: Bobby Ortega, male    DOB: 10-20-1975, 40 y.o.   MRN: [id number]  HPI CPE- pt is concerned b/c father had Hep B.  Wants to be tested to determine if he's a carrier or needs vaccine series.     Review of Systems Patient reports no vision/hearing changes, anorexia, fever ,adenopathy, persistant/recurrent hoarseness, swallowing issues, chest pain, palpitations, edema, persistant/recurrent cough, hemoptysis, dyspnea (rest,exertional, paroxysmal nocturnal), gastrointestinal  bleeding (melena, rectal bleeding), abdominal pain, excessive heart burn, GU symptoms (dysuria, hematuria, voiding/incontinence issues) syncope, focal weakness, memory loss, numbness & tingling, skin/hair/nail changes, depression, anxiety, abnormal bruising/bleeding, musculoskeletal symptoms/signs.     Objective:   Physical Exam General Appearance:    Alert, cooperative, no distress, appears stated age  Head:    Normocephalic, without obvious abnormality, atraumatic  Eyes:    PERRL, conjunctiva/corneas clear, EOM's intact, fundi    benign, both eyes       Ears:    Normal TM's and external ear canals, both ears  Nose:   Nares normal, septum midline, mucosa normal, no drainage   or sinus tenderness  Throat:   Lips, mucosa, and tongue normal; teeth and gums normal  Neck:   Supple, symmetrical, trachea midline, no adenopathy;       thyroid:  No enlargement/tenderness/nodules  Back:     Symmetric, no curvature, ROM normal, no CVA tenderness  Lungs:     Clear to auscultation bilaterally, respirations unlabored  Chest wall:    No tenderness or deformity  Heart:    Regular rate and rhythm, S1 and S2 normal, no murmur, rub   or gallop  Abdomen:     Soft, non-tender, bowel sounds active all four quadrants,    no masses, no organomegaly  Genitalia:    Normal male without lesion, masses,discharge or tenderness  Rectal:    Deferred due to young age  Extremities:   Extremities normal, atraumatic, no  cyanosis or edema  Pulses:   2+ and symmetric all extremities  Skin:   Skin color, texture, turgor normal, no rashes or lesions  Lymph nodes:   Cervical, supraclavicular, and axillary nodes normal  Neurologic:   CNII-XII intact. Normal strength, sensation and reflexes      throughout          Assessment & Plan:

## 2015-09-26 NOTE — Patient Instructions (Signed)
[facility] and make any changes if needed Continue to work on healthy diet and regular exercise- you look great! QUIT SMOKING!!!  You can do it! Call with any questions or concerns Have a great summer!!!

## 2015-09-26 NOTE — Progress Notes (Signed)
Pre visit review using our clinic review tool, if applicable. No additional management support is needed unless otherwise documented below in the visit note. 

## 2015-09-28 LAB — HEPATITIS B SURFACE ANTIBODY,QUALITATIVE: HEP B S AB: NEGATIVE

## 2015-09-28 LAB — HEPATITIS B SURF AG CONFIRMATION: HEPATITIS B SURFACE ANTIGEN CONFIRMATION: POSITIVE — AB

## 2015-09-28 LAB — HEPATITIS B SURFACE ANTIGEN: Hepatitis B Surface Ag: POSITIVE — AB

## 2015-09-28 LAB — HEPATITIS B CORE ANTIBODY, TOTAL: HEP B C TOTAL AB: REACTIVE — AB

## 2015-09-28 NOTE — Assessment & Plan Note (Signed)
Pt's PE WNL.  Check labs.  Anticipatory guidance provided.  

## 2015-09-29 ENCOUNTER — Other Ambulatory Visit: Payer: Self-pay | Admitting: Family Medicine

## 2015-09-29 DIAGNOSIS — R7989 Other specified abnormal findings of blood chemistry: Secondary | ICD-10-CM

## 2015-09-29 DIAGNOSIS — R945 Abnormal results of liver function studies: Principal | ICD-10-CM

## 2015-09-29 DIAGNOSIS — R768 Other specified abnormal immunological findings in serum: Secondary | ICD-10-CM

## 2015-10-13 ENCOUNTER — Other Ambulatory Visit (INDEPENDENT_AMBULATORY_CARE_PROVIDER_SITE_OTHER): Payer: BLUE CROSS/BLUE SHIELD

## 2015-10-13 DIAGNOSIS — R945 Abnormal results of liver function studies: Principal | ICD-10-CM

## 2015-10-13 DIAGNOSIS — R7989 Other specified abnormal findings of blood chemistry: Secondary | ICD-10-CM | POA: Diagnosis not present

## 2015-10-13 LAB — HEPATIC FUNCTION PANEL
ALK PHOS: 64 U/L (ref 39–117)
ALT: 127 U/L — ABNORMAL HIGH (ref 0–53)
AST: 37 U/L (ref 0–37)
Albumin: 4.7 g/dL (ref 3.5–5.2)
Bilirubin, Direct: 0.1 mg/dL (ref 0.0–0.3)
TOTAL PROTEIN: 7.2 g/dL (ref 6.0–8.3)
Total Bilirubin: 0.4 mg/dL (ref 0.2–1.2)

## 2015-10-14 ENCOUNTER — Encounter: Payer: Self-pay | Admitting: Gastroenterology

## 2015-10-14 ENCOUNTER — Other Ambulatory Visit: Payer: Self-pay | Admitting: Family Medicine

## 2015-10-14 DIAGNOSIS — R7401 Elevation of levels of liver transaminase levels: Secondary | ICD-10-CM

## 2015-10-14 DIAGNOSIS — R740 Nonspecific elevation of levels of transaminase and lactic acid dehydrogenase [LDH]: Principal | ICD-10-CM

## 2015-11-17 ENCOUNTER — Encounter: Payer: Self-pay | Admitting: Internal Medicine

## 2015-11-17 ENCOUNTER — Ambulatory Visit (INDEPENDENT_AMBULATORY_CARE_PROVIDER_SITE_OTHER): Payer: BLUE CROSS/BLUE SHIELD | Admitting: Internal Medicine

## 2015-11-17 DIAGNOSIS — B181 Chronic viral hepatitis B without delta-agent: Secondary | ICD-10-CM | POA: Insufficient documentation

## 2015-11-17 NOTE — Progress Notes (Signed)
    [facility]      Reason for Consult: chronic hepatitis B    Referring Physician: Dr. Beverely Lowabori    Patient ID: Bobby Ortega, male    DOB: June 22, 1975, 40 y.o.   MRN: [id number]  HPI:   He is here for evaluation of hepatitis B.  Diagnosed as part of a work up for transaminitis.  Has multiple family members with hepatitis B including his mother, two siblings.  Father died of complications of alcoholic cirrhosis, was hepatitis B negative.  He does not recall testing for hepatitis C.  Does not recall any vaccinations for hepatitis A.  No history of jaundice, no known liver issues.  Does not drink alcohol.  No weight loss.  [place]  Has not had a previous abdominal ultrasound, no previous biopsy.   Previous record reviewed from PCP and is surface Ag positive, has some transaminitis.   Past Medical History:  Diagnosis Date  . Hyperlipidemia     Medications: none   No Known Allergies  Social History  Substance Use Topics  . Smoking status: Current Every Day Smoker    Packs/day: 1.00    Years: 18.00    Types: Cigarettes  . Smokeless tobacco: Never Used  . Alcohol use No    FMHx: denies any liver cancer, father with alcoholic cirrhosis  Review of Systems  Constitutional: negative for fatigue and malaise Cardiovascular: negative for fatigue Gastrointestinal: negative for diarrhea Musculoskeletal: negative for myalgias and arthralgias All other systems reviewed and are negative   Constitutional: in no apparent distress and alert  Vitals:   11/17/15 1400  BP: 137/84  Pulse: 81  Temp: 98.1 F (36.7 C)   EYES: anicteric ENMT: Cardiovascular: Cor RRR Respiratory: CTA B; normal respiratory effort GI: Bowel sounds are normal, liver is not enlarged, spleen is not enlarged Musculoskeletal: no pedal edema noted Skin: negatives: no rash Hematologic: no cervical or supraclavicular lad  Labs: Lab Results  Component Value Date   WBC  6.5 09/26/2015   HGB 14.0 09/26/2015   HCT 42.3 09/26/2015   MCV 88.3 09/26/2015   PLT 267 09/26/2015    Lab Results  Component Value Date   CREATININE 0.88 09/26/2015   BUN 12 09/26/2015   NA 139 09/26/2015   K 3.8 09/26/2015   CL 101 09/26/2015   CO2 26 09/26/2015    Lab Results  Component Value Date   ALT 127 (H) 10/13/2015   AST 37 10/13/2015   ALKPHOS 64 10/13/2015   BILITOT 0.4 10/13/2015     Assessment: chronic hepatitis B.  I discussed the natural history of hepatitis B, the oncogenesis in any stage of liver fibrosis, the need to monitor labs and elastography.  I discussed treatment options and the lack of cure.  Also that most people do not need treatment initially and only need periodic monitoring unless the viral load is high and there is some liver fibrosis.  Treatment options then including tenofovir and entecavir     Plan: 1) check hepatitis B labs including DNA, e Ab and e Ag. 2) liver staging with elastography 3)rtc after those results.

## 2015-11-18 ENCOUNTER — Telehealth: Payer: Self-pay

## 2015-11-18 LAB — HIV ANTIBODY (ROUTINE TESTING W REFLEX): HIV 1&2 Ab, 4th Generation: NONREACTIVE

## 2015-11-18 LAB — HEPATITIS A ANTIBODY, TOTAL: Hep A Total Ab: REACTIVE — AB

## 2015-11-18 LAB — HEPATITIS C ANTIBODY: HCV Ab: NEGATIVE

## 2015-11-18 NOTE — Telephone Encounter (Signed)
Called [employer] and [employer] to check if patient needs a prior-authorization for an elastography(76700) and [employer] health [employer]BS customer service representative Heather V. stated no prior authorization is needed for patient's plan. Rejeana Brockandace Murray, LPN

## 2015-11-20 LAB — HEPATITIS B E ANTIBODY: HEPATITIS BE ANTIBODY: REACTIVE — AB

## 2015-11-20 LAB — HEPATITIS B E ANTIGEN: Hepatitis Be Antigen: NONREACTIVE

## 2015-11-21 LAB — HEPATITIS B DNA, ULTRAQUANTITATIVE, PCR
Hepatitis B DNA (Calc): 4.13 Log IU/mL — ABNORMAL HIGH (ref <1.30)
Hepatitis B DNA: 13498 IU/mL — ABNORMAL HIGH (ref <20)

## 2015-12-16 ENCOUNTER — Ambulatory Visit: Payer: BLUE CROSS/BLUE SHIELD | Admitting: Gastroenterology

## 2015-12-30 ENCOUNTER — Ambulatory Visit (HOSPITAL_COMMUNITY): Payer: BLUE CROSS/BLUE SHIELD

## 2016-01-01 ENCOUNTER — Ambulatory Visit: Payer: BLUE CROSS/BLUE SHIELD | Admitting: Internal Medicine

## 2017-02-22 IMAGING — DX DG FOOT COMPLETE 3+V*L*
3 series · 3 of 3 positions shown · non-contrast
Comparison: None.

CLINICAL DATA: Stepped on something a few days ago. Puncture wound
on plantar surface of foot.

EXAM:
LEFT FOOT - COMPLETE 3+ VIEW

[foot ap]
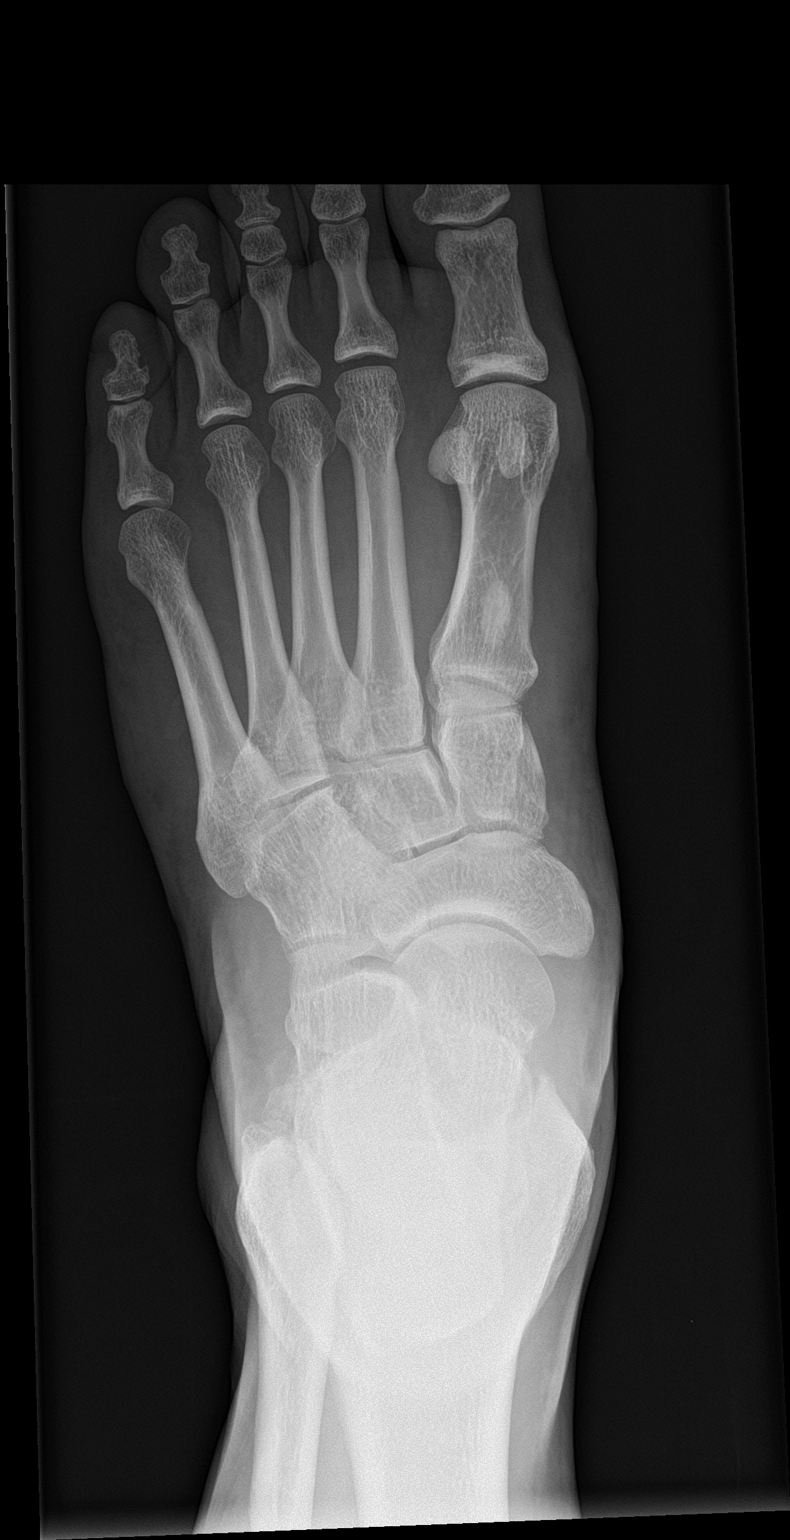

[foot obl]
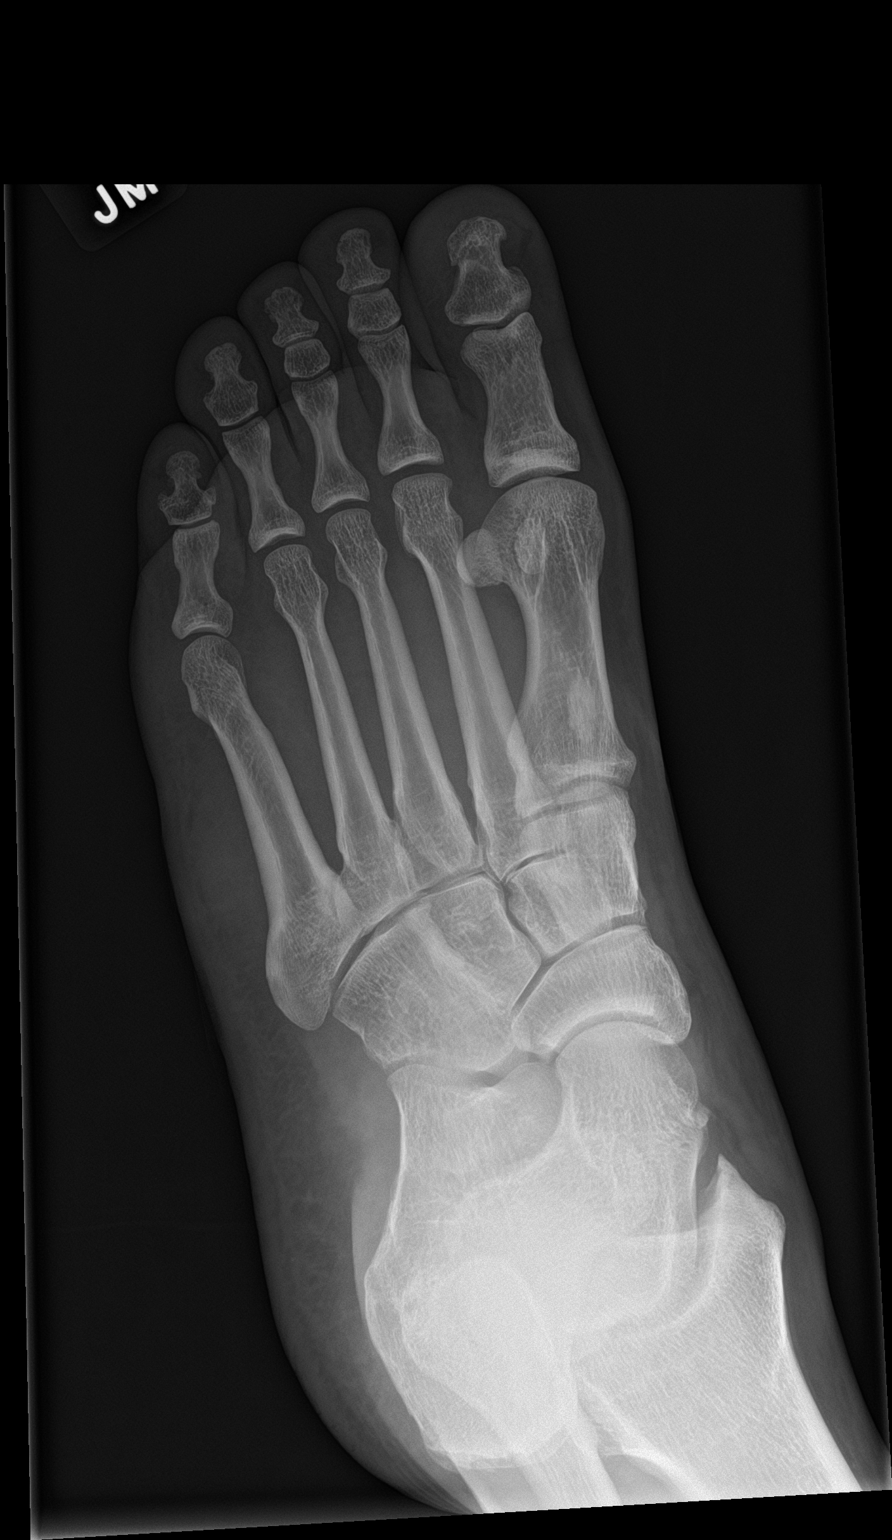

[foot lat]
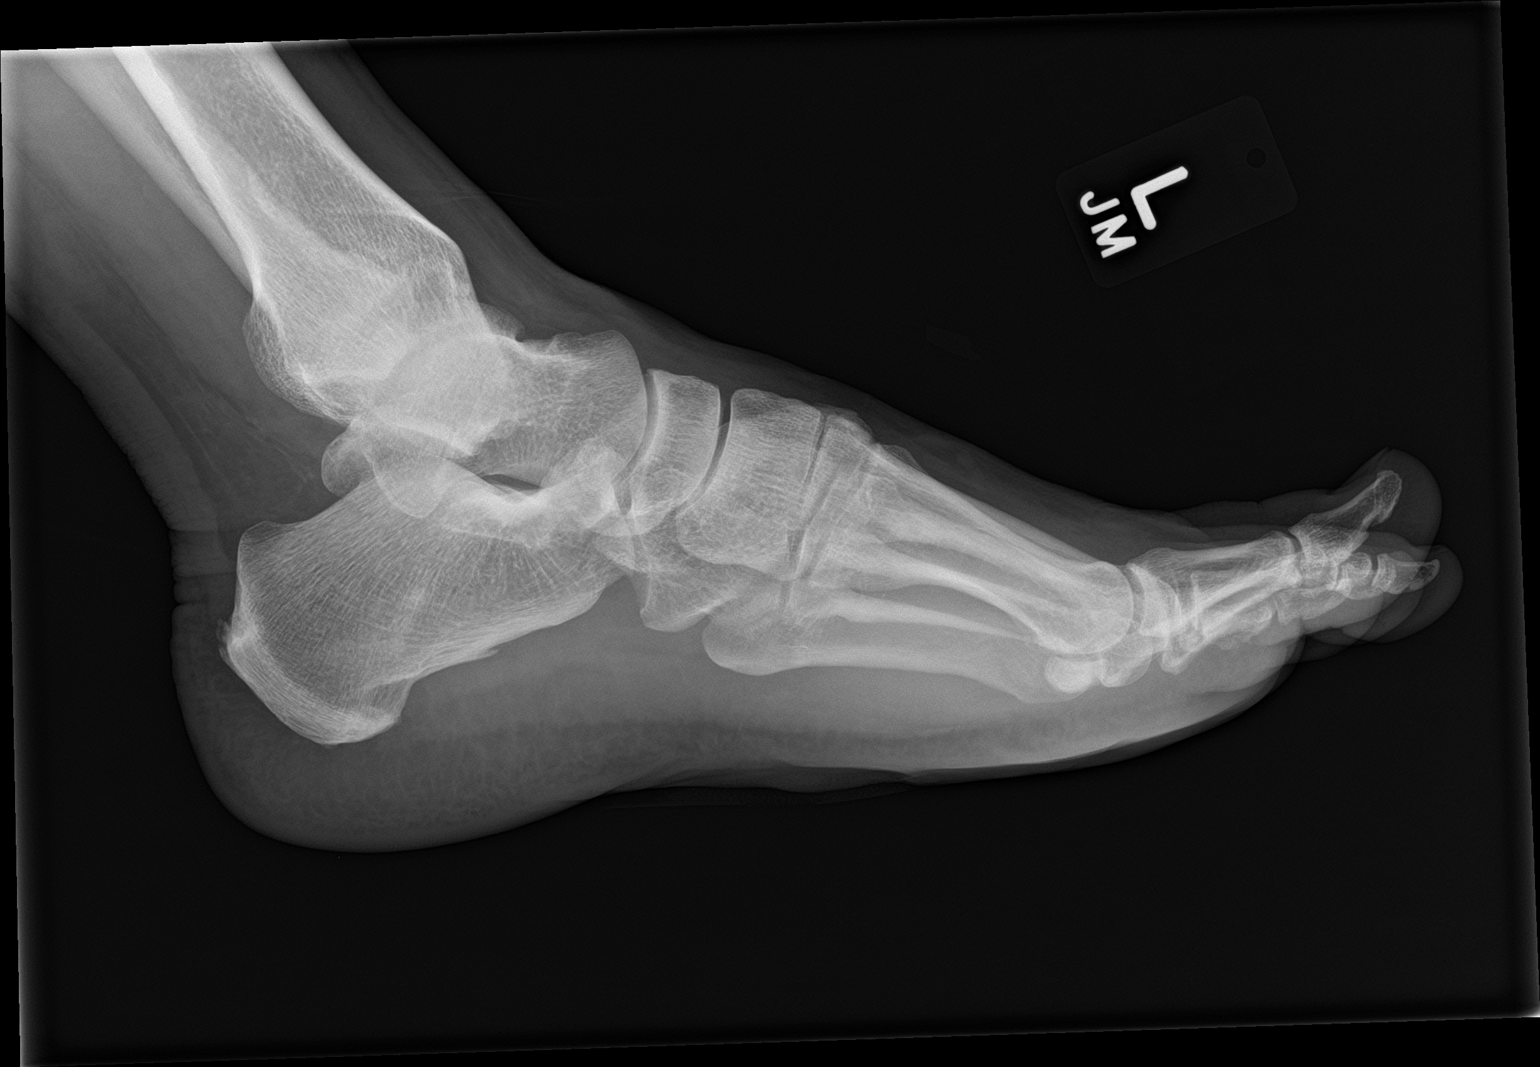

[3 of 3 positions shown; findings below may reference images not displayed]

FINDINGS: There is no evidence of fracture or dislocation. There is no
evidence of arthropathy or other focal bone abnormality. Soft
tissues are unremarkable.
IMPRESSION: Negative.

## 2017-08-24 ENCOUNTER — Encounter: Payer: Self-pay | Admitting: General Practice

## 2023-12-06 ENCOUNTER — Encounter: Payer: Self-pay | Admitting: Student in an Organized Health Care Education/Training Program

## 2023-12-07 ENCOUNTER — Ambulatory Visit: Payer: Self-pay | Admitting: Student in an Organized Health Care Education/Training Program

## 2023-12-07 ENCOUNTER — Encounter: Payer: Self-pay | Admitting: Student in an Organized Health Care Education/Training Program

## 2023-12-07 VITALS — BP 134/92 | HR 62 | Ht 71.5 in | Wt 250.0 lb

## 2023-12-07 DIAGNOSIS — K921 Melena: Secondary | ICD-10-CM | POA: Diagnosis not present

## 2023-12-07 DIAGNOSIS — E785 Hyperlipidemia, unspecified: Secondary | ICD-10-CM | POA: Insufficient documentation

## 2023-12-07 DIAGNOSIS — B181 Chronic viral hepatitis B without delta-agent: Secondary | ICD-10-CM

## 2023-12-07 DIAGNOSIS — Z131 Encounter for screening for diabetes mellitus: Secondary | ICD-10-CM | POA: Diagnosis not present

## 2023-12-07 DIAGNOSIS — R030 Elevated blood-pressure reading, without diagnosis of hypertension: Secondary | ICD-10-CM | POA: Diagnosis not present

## 2023-12-07 LAB — COMPREHENSIVE METABOLIC PANEL WITH GFR
ALT: 27 U/L (ref 0–53)
AST: 26 U/L (ref 0–37)
Albumin: 4.6 g/dL (ref 3.5–5.2)
Alkaline Phosphatase: 48 U/L (ref 39–117)
BUN: 17 mg/dL (ref 6–23)
CO2: 32 meq/L (ref 19–32)
Calcium: 9.5 mg/dL (ref 8.4–10.5)
Chloride: 103 meq/L (ref 96–112)
Creatinine, Ser: 0.89 mg/dL (ref 0.40–1.50)
GFR: 100.8 mL/min (ref >60.00)
Glucose, Bld: 98 mg/dL (ref 70–99)
Potassium: 3.9 meq/L (ref 3.5–5.1)
Sodium: 142 meq/L (ref 135–145)
Total Bilirubin: 0.8 mg/dL (ref 0.2–1.2)
Total Protein: 7.8 g/dL (ref 6.0–8.3)

## 2023-12-07 LAB — CBC
HCT: 44.8 % (ref 39.0–52.0)
Hemoglobin: 14.8 g/dL (ref 13.0–17.0)
MCHC: 33.1 g/dL (ref 30.0–36.0)
MCV: 90.7 fl (ref 78.0–100.0)
Platelets: 214 K/uL (ref 150.0–400.0)
RBC: 4.94 Mil/uL (ref 4.22–5.81)
RDW: 14.1 % (ref 11.5–15.5)
WBC: 4.5 K/uL (ref 4.0–10.5)

## 2023-12-07 LAB — LIPID PANEL
Cholesterol: 219 mg/dL — ABNORMAL HIGH (ref 0–200)
HDL: 50.1 mg/dL (ref >39.00)
LDL Cholesterol: 157 mg/dL — ABNORMAL HIGH (ref 0–99)
NonHDL: 169.06
Total CHOL/HDL Ratio: 4
Triglycerides: 62 mg/dL (ref 0.0–149.0)
VLDL: 12.4 mg/dL (ref 0.0–40.0)

## 2023-12-07 LAB — HEMOGLOBIN A1C: Hgb A1c MFr Bld: 5.7 % (ref 4.6–6.5)

## 2023-12-07 LAB — TIQ-NTM

## 2023-12-07 NOTE — Assessment & Plan Note (Signed)
 He has chronic hepatitis B with no prior treatment, increasing his risk for liver disease and cancer, especially with a family history of cirrhosis. We discussed monitoring liver function and viral load and reassured him about non-invasive management. Ordered blood work for liver function and hepatitis B viral load.  He is at increased risk for hepatocellular carcinoma, will check an AFP today and ordered a liver ultrasound.  Based on his labs from 2017, I think he is at risk for immune-active hepatitis B infection.  Will refer to GI to consider treatment with TAF.  He previously had a consultation with the ID clinic, but did not fully feel comfortable in that setting.

## 2023-12-07 NOTE — Assessment & Plan Note (Signed)
 He experiences intermittent rectal bleeding with bright red blood. The differential diagnosis includes other lower GI sources.  Rectal exam was reassuring with no external hemorrhoids, fissures, or masses.  A colonoscopy is necessary to rule out serious conditions like colorectal cancer. Refered to GI for colonoscopy.

## 2023-12-07 NOTE — Patient Instructions (Signed)
  VISIT SUMMARY: Today, we discussed your recent experience with blood in your stool and your chronic hepatitis B infection. We reviewed your symptoms, family history, and lifestyle habits to develop a plan for further evaluation and management.  YOUR PLAN: -RECTAL BLEEDING: Rectal bleeding refers to the presence of bright red blood in your stool. This can be caused by various conditions, including hemorrhoids or more serious issues like colorectal cancer. To determine the cause, you will need a colonoscopy. We will refer you to a gastroenterologist for this procedure.  -CHRONIC HEPATITIS B INFECTION: Chronic hepatitis B is a long-term infection of the liver that can increase the risk of liver disease and cancer. Given your family history of cirrhosis, it is important to monitor your liver function and viral load regularly. We will order blood tests to check your liver function and hepatitis B viral load, and you should have a liver ultrasound every 6-12 months. We will follow up in 6 months to review your results.  INSTRUCTIONS: Please schedule a colonoscopy with a gastroenterologist to investigate the cause of your rectal bleeding. Additionally, complete the blood work for liver function and hepatitis B viral load as soon as possible. Schedule a liver ultrasound every 6-12 months. Follow up with us  in 6 months to review your test results and discuss further management.

## 2023-12-07 NOTE — Progress Notes (Signed)
 New Patient Office Visit  Subjective    Patient ID: Bobby Ortega, male    DOB: [birth year]  Age: 48 y.o. MRN: [id number]  CC:  Chief Complaint  Patient presents with   Establish Care    No concerns   Referral colonoscopy     HPI  Discussed the use of AI scribe software for clinical note transcription with the patient, who gave verbal consent to proceed.  History of Present Illness Bobby Ortega is a 48 year old male with hepatitis B who presents with blood in stool.  He noticed blood in his stool a few weeks ago, described as red in color, which lasted for one or two days and has not recurred. No associated pain was noted. This is the first occurrence of such symptoms, although he recalls a similar incident a few years ago.  He has a history of hepatitis B, which remains untreated. He was diagnosed with hepatitis B around the age of [age]. No new symptoms related to hepatitis B have been experienced recently.  His father died of cirrhosis after working on a farm and being exposed to chemicals.  He exercises five days a week and maintains a stable weight of 250 pounds. He does not consume alcohol and quit smoking in 2017. He takes a supplement called Reishi, which he believes helps reduce allergic reactions to bee stings. He carries an EpiPen for bee sting allergies, though no severe reactions have occurred recently.  He lives with his wife and has a 37 year old child. He works from home and reports no significant stress at home or work. No mood issues such as depression or anxiety and no recent hospital visits or surgeries in the past five to six years.   He lives in [place] with his wife and 49 year old son, he works remotely from home.    Past Medical History:  Diagnosis Date   Hyperlipidemia    TOBACCO USE 05/13/2008    Past Surgical History:  Procedure Laterality Date   APPENDECTOMY      Family History  Problem Relation Age of Onset   Cancer Mother        breast   Cancer  Father        Liver        Objective    BP (!) 134/92   Pulse 62   Ht 5' 11.5 (1.816 m)   Wt 250 lb (113.4 kg)   SpO2 100%   BMI 34.38 kg/m   Physical Exam  Gen: Well-appearing man Neck: Normal thyroid, no nodules or adenopathy Heart: Regular, no murmur Lungs: Unlabored, clear throughout Abd: Soft, nontender, no hernias, no organomegaly, no ascites, no stigmata of cirrhosis or portal hypertension Rectal: Normal rectum, no external hemorrhoids, fissures or masses Ext: Warm, no edema, normal joints Neuro: Alert, conversational, normal gait and balance, full strength upper and lower extremities Psych: Appropriate mood and affect, not anxious or depressed appearing      Assessment & Plan:    Problem List Items Addressed This Visit       High   Chronic hepatitis B (HCC) - Primary (Chronic)   He has chronic hepatitis B with no prior treatment, increasing his risk for liver disease and cancer, especially with a family history of cirrhosis. We discussed monitoring liver function and viral load and reassured him about non-invasive management. Ordered blood work for liver function and hepatitis B viral load.  He is at increased risk for hepatocellular carcinoma, will check an AFP today and  ordered a liver ultrasound.  Based on his labs from 2017, I think he is at risk for immune-active hepatitis B infection.  Will refer to GI to consider treatment with TAF.  He previously had a consultation with the ID clinic, but did not fully feel comfortable in that setting.      Relevant Orders   Comprehensive metabolic panel with GFR   CBC   Hepatitis B Surface AntiGEN   Hepatitis B surface antibody,qualitative   Hepatitis B DNA, ultraquantitative, PCR   Hepatitis B E Antigen   Hepatitis B E Antibody   AFP tumor marker   US  Abdomen Limited RUQ (LIVER/GB)   Ambulatory referral to Gastroenterology     Medium    Prehypertension (Chronic)   Mild prehypertension with blood pressure at  134/92.  Will plan to monitor this every 6-12 months.  I do not think we need antihypertensives right now.  Patient is a bit of a minimalist, and would not want to accept medications unless they were very high value.      Hyperlipidemia (Chronic)   Relevant Orders   Lipid panel     Low   Hematochezia   He experiences intermittent rectal bleeding with bright red blood. The differential diagnosis includes other lower GI sources.  Rectal exam was reassuring with no external hemorrhoids, fissures, or masses.  A colonoscopy is necessary to rule out serious conditions like colorectal cancer. Refered to GI for colonoscopy.      Relevant Orders   Ambulatory referral to Gastroenterology   Other Visit Diagnoses       Screening for diabetes mellitus       Relevant Orders   Hemoglobin A1c       Return in about 6 months (around 06/05/2024).   Cleatus Debby Specking, MD

## 2023-12-07 NOTE — Assessment & Plan Note (Signed)
 Mild prehypertension with blood pressure at 134/92.  Will plan to monitor this every 6-12 months.  I do not think we need antihypertensives right now.  Patient is a bit of a minimalist, and would not want to accept medications unless they were very high value.

## 2023-12-17 LAB — HEPATITIS B SURFACE ANTIGEN: Hepatitis B Surface Ag: REACTIVE — AB

## 2023-12-17 LAB — HEPATITIS B DNA, ULTRAQUANTITATIVE, PCR
Hepatitis B DNA: 7950 [IU]/mL — ABNORMAL HIGH
Hepatitis B DNA: 9520 [IU]/mL — AB
Hepatitis B virus DNA: 3.9 {Log_IU}/mL — ABNORMAL HIGH
Hepatitis B virus DNA: 3.98 {Log_IU}/mL — ABNORMAL HIGH

## 2023-12-17 LAB — HEPATITIS B SURFACE ANTIBODY,QUALITATIVE: Hep B S Ab: NONREACTIVE

## 2023-12-17 LAB — AFP TUMOR MARKER: AFP-Tumor Marker: 2.6 ng/mL (ref <6.1)

## 2023-12-17 LAB — HEPATITIS B E ANTIBODY: Hep B E Ab: REACTIVE — AB

## 2023-12-17 LAB — HEPATITIS B E ANTIGEN: Hep B E Ag: NONREACTIVE

## 2023-12-19 ENCOUNTER — Ambulatory Visit: Payer: Self-pay | Admitting: Student in an Organized Health Care Education/Training Program

## 2024-06-05 ENCOUNTER — Ambulatory Visit: Admitting: Student in an Organized Health Care Education/Training Program
